# Patient Record
Sex: Female | Born: 1960 | Race: Black or African American | Hispanic: No | Marital: Married | Smoking: Never smoker
Health system: Southern US, Community
[De-identification: ages and names within clinical notes are randomized; demographics above are authoritative.]

## PROBLEM LIST (undated history)

## (undated) DIAGNOSIS — I10 Essential (primary) hypertension: Secondary | ICD-10-CM

## (undated) HISTORY — PX: OVARIAN CYST REMOVAL: SHX89

---

## 2013-06-03 ENCOUNTER — Emergency Department: Payer: Self-pay | Admitting: Emergency Medicine

## 2013-10-18 ENCOUNTER — Emergency Department: Payer: Self-pay | Admitting: Emergency Medicine

## 2013-10-18 LAB — COMPREHENSIVE METABOLIC PANEL
ALBUMIN: 3.4 g/dL (ref 3.4–5.0)
ALK PHOS: 145 U/L — AB
ANION GAP: 4 — AB (ref 7–16)
BUN: 17 mg/dL (ref 7–18)
Bilirubin,Total: 0.4 mg/dL (ref 0.2–1.0)
CALCIUM: 8.8 mg/dL (ref 8.5–10.1)
CO2: 26 mmol/L (ref 21–32)
Chloride: 108 mmol/L — ABNORMAL HIGH (ref 98–107)
Creatinine: 0.91 mg/dL (ref 0.60–1.30)
EGFR (African American): >60
GLUCOSE: 106 mg/dL — AB (ref 65–99)
OSMOLALITY: 278 (ref 275–301)
Potassium: 3.4 mmol/L — ABNORMAL LOW (ref 3.5–5.1)
SGOT(AST): 34 U/L (ref 15–37)
SGPT (ALT): 29 U/L (ref 12–78)
SODIUM: 138 mmol/L (ref 136–145)
TOTAL PROTEIN: 8.2 g/dL (ref 6.4–8.2)

## 2013-10-18 LAB — CBC WITH DIFFERENTIAL/PLATELET
Basophil #: 0.1 10*3/uL (ref 0.0–0.1)
Basophil %: 1.2 %
Eosinophil #: 0 10*3/uL (ref 0.0–0.7)
Eosinophil %: 1 %
HCT: 34.8 % — ABNORMAL LOW (ref 35.0–47.0)
HGB: 11 g/dL — ABNORMAL LOW (ref 12.0–16.0)
Lymphocyte #: 1 10*3/uL (ref 1.0–3.6)
Lymphocyte %: 23.9 %
MCH: 24.8 pg — ABNORMAL LOW (ref 26.0–34.0)
MCHC: 31.6 g/dL — ABNORMAL LOW (ref 32.0–36.0)
MCV: 78 fL — ABNORMAL LOW (ref 80–100)
Monocyte #: 0.2 x10 3/mm (ref 0.2–0.9)
Monocyte %: 5.4 %
NEUTROS PCT: 68.5 %
Neutrophil #: 2.8 10*3/uL (ref 1.4–6.5)
PLATELETS: 144 10*3/uL — AB (ref 150–440)
RBC: 4.43 10*6/uL (ref 3.80–5.20)
RDW: 18.3 % — ABNORMAL HIGH (ref 11.5–14.5)
WBC: 4.1 10*3/uL (ref 3.6–11.0)

## 2013-10-18 LAB — URINALYSIS, COMPLETE
BACTERIA: NONE SEEN
BILIRUBIN, UR: NEGATIVE
Blood: NEGATIVE
GLUCOSE, UR: NEGATIVE mg/dL (ref 0–75)
KETONE: NEGATIVE
LEUKOCYTE ESTERASE: NEGATIVE
NITRITE: NEGATIVE
PH: 5 (ref 4.5–8.0)
PROTEIN: NEGATIVE
RBC,UR: NONE SEEN /HPF (ref 0–5)
Specific Gravity: 1.023 (ref 1.003–1.030)
Squamous Epithelial: <1
WBC UR: NONE SEEN /HPF (ref 0–5)

## 2014-09-26 LAB — COMPREHENSIVE METABOLIC PANEL
ALBUMIN: 3.2 g/dL — AB
AST: 23 U/L
Alkaline Phosphatase: 79 U/L
Anion Gap: 7 (ref 7–16)
BUN: 13 mg/dL
Bilirubin,Total: 0.3 mg/dL
CALCIUM: 9.1 mg/dL
Chloride: 101 mmol/L
Co2: 30 mmol/L
Creatinine: 0.93 mg/dL
EGFR (Non-African Amer.): >60
Glucose: 118 mg/dL — ABNORMAL HIGH
POTASSIUM: 2.7 mmol/L — AB
SGPT (ALT): 16 U/L
Sodium: 138 mmol/L
Total Protein: 8.8 g/dL — ABNORMAL HIGH

## 2014-09-26 LAB — CBC WITH DIFFERENTIAL/PLATELET
BASOS ABS: 0 10*3/uL (ref 0.0–0.1)
BASOS PCT: 0.3 %
Eosinophil #: 0 10*3/uL (ref 0.0–0.7)
Eosinophil %: 0.3 %
HCT: 34.9 % — AB (ref 35.0–47.0)
HGB: 11.3 g/dL — AB (ref 12.0–16.0)
LYMPHS PCT: 14.1 %
Lymphocyte #: 1.1 10*3/uL (ref 1.0–3.6)
MCH: 27.8 pg (ref 26.0–34.0)
MCHC: 32.4 g/dL (ref 32.0–36.0)
MCV: 86 fL (ref 80–100)
Monocyte #: 0.4 x10 3/mm (ref 0.2–0.9)
Monocyte %: 4.7 %
NEUTROS PCT: 80.6 %
Neutrophil #: 6.1 10*3/uL (ref 1.4–6.5)
Platelet: 174 10*3/uL (ref 150–440)
RBC: 4.07 10*6/uL (ref 3.80–5.20)
RDW: 14.2 % (ref 11.5–14.5)
WBC: 7.5 10*3/uL (ref 3.6–11.0)

## 2014-09-26 LAB — URINALYSIS, COMPLETE: Specific Gravity: 1.05 (ref 1.003–1.030)

## 2014-09-27 ENCOUNTER — Inpatient Hospital Stay
Admit: 2014-09-27 | Disposition: A | Discharge status: Home / Self Care | Payer: Self-pay | Attending: Obstetrics and Gynecology | Admitting: Obstetrics and Gynecology

## 2014-09-27 LAB — CBC WITH DIFFERENTIAL/PLATELET
BASOS PCT: 0.1 %
Basophil #: 0 10*3/uL (ref 0.0–0.1)
EOS ABS: 0 10*3/uL (ref 0.0–0.7)
Eosinophil %: 0.1 %
HCT: 32.2 % — AB (ref 35.0–47.0)
HGB: 10.3 g/dL — AB (ref 12.0–16.0)
Lymphocyte #: 0.5 10*3/uL — ABNORMAL LOW (ref 1.0–3.6)
Lymphocyte %: 5.6 %
MCH: 27.4 pg (ref 26.0–34.0)
MCHC: 31.8 g/dL — AB (ref 32.0–36.0)
MCV: 86 fL (ref 80–100)
MONO ABS: 0.5 x10 3/mm (ref 0.2–0.9)
Monocyte %: 5.1 %
NEUTROS ABS: 8.1 10*3/uL — AB (ref 1.4–6.5)
NEUTROS PCT: 89.1 %
Platelet: 163 10*3/uL (ref 150–440)
RBC: 3.74 10*6/uL — ABNORMAL LOW (ref 3.80–5.20)
RDW: 14.2 % (ref 11.5–14.5)
WBC: 9.1 10*3/uL (ref 3.6–11.0)

## 2014-09-27 LAB — HCG, QUANTITATIVE, PREGNANCY: Beta Hcg, Quant.: 4 m[IU]/mL

## 2014-09-27 LAB — WET PREP, GENITAL

## 2014-09-27 LAB — GC/CHLAMYDIA PROBE AMP

## 2014-09-27 LAB — APTT: Activated PTT: 27 secs (ref 23.6–35.9)

## 2014-09-27 LAB — PROTIME-INR
INR: 1.1
Prothrombin Time: 14.5 secs

## 2014-09-29 DIAGNOSIS — Z01818 Encounter for other preprocedural examination: Secondary | ICD-10-CM

## 2014-09-29 LAB — URINALYSIS, COMPLETE
Bacteria: NONE SEEN
Bilirubin,UR: NEGATIVE
Blood: NEGATIVE
Glucose,UR: NEGATIVE mg/dL (ref 0–75)
Ketone: NEGATIVE
Nitrite: NEGATIVE
Ph: 6 (ref 4.5–8.0)
Specific Gravity: 1.025 (ref 1.003–1.030)

## 2014-09-29 LAB — CBC WITH DIFFERENTIAL/PLATELET
BASOS ABS: 0 10*3/uL (ref 0.0–0.1)
Basophil %: 0.2 %
Eosinophil #: 0 10*3/uL (ref 0.0–0.7)
Eosinophil %: 0.4 %
HCT: 30.1 % — AB (ref 35.0–47.0)
HGB: 9.8 g/dL — ABNORMAL LOW (ref 12.0–16.0)
LYMPHS PCT: 8.9 %
Lymphocyte #: 0.6 10*3/uL — ABNORMAL LOW (ref 1.0–3.6)
MCH: 27.8 pg (ref 26.0–34.0)
MCHC: 32.8 g/dL (ref 32.0–36.0)
MCV: 85 fL (ref 80–100)
Monocyte #: 0.5 x10 3/mm (ref 0.2–0.9)
Monocyte %: 7.4 %
NEUTROS ABS: 5.7 10*3/uL (ref 1.4–6.5)
Neutrophil %: 83.1 %
Platelet: 195 10*3/uL (ref 150–440)
RBC: 3.55 10*6/uL — ABNORMAL LOW (ref 3.80–5.20)
RDW: 14.4 % (ref 11.5–14.5)
WBC: 6.9 10*3/uL (ref 3.6–11.0)

## 2014-09-29 LAB — POTASSIUM: POTASSIUM: 2.8 mmol/L — AB

## 2014-09-30 LAB — URINE CULTURE

## 2014-10-01 LAB — BASIC METABOLIC PANEL
ANION GAP: 7 (ref 7–16)
BUN: 9 mg/dL
CHLORIDE: 103 mmol/L
CREATININE: 0.67 mg/dL
Calcium, Total: 8.4 mg/dL — ABNORMAL LOW
Co2: 27 mmol/L
EGFR (African American): >60
EGFR (Non-African Amer.): >60
GLUCOSE: 142 mg/dL — AB
Potassium: 3.9 mmol/L
Sodium: 137 mmol/L

## 2014-10-01 LAB — HEMATOCRIT: HCT: 30.3 % — AB (ref 35.0–47.0)

## 2014-10-04 LAB — SURGICAL PATHOLOGY

## 2014-10-10 NOTE — Consult Note (Signed)
Gynecology ER Consult Noteof Consult: 4/18/2016Physician: [place] Bingharlie Lasaundra Riche, Montez HagemanJr. MD ([facility])OBGYN: nonenoneService[facility] abdominal pain x 1 week 54 y/o P8 (LMP 4 weeks ago) with the above CC. PMHx significant for HTN seen in the ER, BMI 32 and atheriosclerosis seen on CT scan today, h/o c-section x 12.  Patient has one week of LLQ pain that feels like menstrual cramps that was off and on and then felt increasd so she came to the ER. She thought she had a UTI so she took some OTC apap, ibuprofen and dysuria medication that helped until most recently. No fevers, chills, nausea, vomiting (except when she got a medication last night in the ER), chest pain, SOB, diarrhea, constipation (last BM yesterday), dysuria or hematuria.  She is having some VB and states that she's due for a period, although she sometimes goes a few months w/o a period and is having peri-menopausal s/s suchas hot flashes and night sweats. the ER, her CBC is negative (WBC 7.5); CMP negative except for glucose of 118, K 2.7 and albumin 3.2, U/A uninterpretable likely due to OTC medication but rare bacteria and 0-5 squams; negative GC/CT swab and wet prep with just clue cells. CT scan showed a left adnexal fluid collection of 2.7cm that ws enhancing with thickened wall and a 6cm fundal fibroid. Follow up TVUS showed a 10.9 x 5.6 x 6.8cm uterus with a 4.7cm IM fundal fibroid, with ES unable to be seen due to fibroid. RO not seen but LO 3cm with normal flow and 8cm tubular strcture with debris with some vascularity on the peripheray. Images felt c/w pyosalpinx.  In the ER, she's received 50 of fent, 1.5 of diluadid and started on cefoxy/doxy already and had her k supplemented states that she's hungry and isn't in any current pain. Last PO before [place] as aboveas above left knee replacementas above. no h/o STI or abnormal [place] [facility] or illicit drug use. works at  [employer] (as a child)non contributoryas above  70s-100s  120s-150s/80s-100s  18  96/RAwatching TVno MRGsobese, soft, ND. minimally TTP near left ASIS, no peritoneal s/s, +BSno CMT, minimally TTP near left ASIS. minimal old blood/mucus on glove.no c/c/e as aboveas above LLQ pain with left adnexal fluid collection c/w pyosalpingx; pt stableto GYNspoke with o/n radiologist who thought it likely is amenable to IR drainage. beta hcg ordered and set up for CT guided IR drainaged. continue with cefoxy/doxy until after procedure and then can tailor regimen to cultures, cell count, gram stain, etc.  will add flagyl for clue cells seenIV prns. transition to PO once procedure is doneNPO, MIVF, IV anti-emeticsOOB ad lib, SCDspending IR attempt and improved pain.     Electronic Signatures: [place] BingPickens, Fowler Antos (MD) (Signed on 18-Apr-16 04:48)  Authored   Last Updated: 20-Apr-16 07:37 by [place] BingPickens, Mattheus Rauls (MD)

## 2014-10-10 NOTE — H&P (Signed)
   Subjective/Chief Complaint see GYN consult note for details   Past Med/Surgical Hx:  Denies medical history:   left knee replacement:   ALLERGIES:  Penicillin: Rash  Electronic Signatures: [place] BingPickens, Catia Todorov (MD)  (Signed 18-Apr-16 04:57)  Authored: CHIEF COMPLAINT and HISTORY, PAST MEDICAL/SURGIAL HISTORY, ALLERGIES   Last Updated: 18-Apr-16 04:57 by [place] BingPickens, Marcell Pfeifer (MD)

## 2014-10-10 NOTE — Op Note (Signed)
PATIENT NAME:  Sheila Rosales, Sheila Rosales MR#:  [id number] DATE OF BIRTH:  1961-05-28  DATE OF PROCEDURE:  09/30/2014  PREOPERATIVE DIAGNOSES:  1. Abdominal pain.  2. Questionable left adnexal pyosalpinx.  3. BMI 32.   PREOPERATIVE DIAGNOSES:  1. Abdominal pain.  2. Questionable left adnexal pyosalpinx.  3. BMI 32.  4. Severe pelvic adhesive disease.  5.  ?endometriosis in right ovarian cyst  PROCEDURE: Diagnostic laparoscopy, lysis of adhesions greater than 45 minutes, laparoscopic  bilateral salpingo-oophorectomy and cystoscopy.   SURGEON: [place] Bingharlie Hallie Ertl, MD   ASSISTANT: Thomasene MohairStephen Jackson MD  ESTIMATED BLOOD LOSS: 150 mL.   Intravenous fluids: 2300 mL crystalloid.   URINE OUTPUT: 700 mL.   ANTIBIOTICS: None indicated.   DEEP VEIN THROMBOSIS PROPHYLAXIS: SCDs applied to bilateral lower extremities.   SPECIMENS: Bilateral tubes and ovaries to pathology.   COMPLICATIONS: Accidental injury of bowel epiploica nearthe rectum during coagulation mode with the monopolar scissors during lysis of adhesions.   INTRAOPERATIVE CONSULTATION: Dr. Excell Seltzerooper of general surgery.   FINDINGS: On diagnostic laparoscopy, severe pelvic adhesive disease was seen in the lower abdominal pelvic region. Multiple loops of bowel were adherent to the pelvic sidewall, as well as near the adnexa and uterus. These were easily lysed by gentle traction. Grossly appearing uterus that it appeared approximately 10 week size, with moderate adhesions of anterior uterus to the posterior aspect of the anterior abdominal wall, consistent with her prior c-section.. Distinct ovaries were not seen with dilated bilateral fallopian tubes of left greater than right. Small left ovarian cyst punctured with slightly brownish fluid, with question of endometrioma there; there was no obvious evidence of endometriosis, such as chocolate cysts or powder burn spots. The left fallopian tube was inadvertently punctured and yellowish purulent material,  approximately 25 mL, was expressed with the fallopian tube not fully deflating, so there is likely more inside. Normal bladder on cystoscopy with bilateral efflux from the ureteral orifices bilaterally. Small atrophic ovaries seen with possibility of some residual ovarian tissue vs inflammation seen in lower cul-de-sac. Tough, sticky white material seen in posterior cul-de-sac consistent with old inflammation.  DESCRIPTION OF PROCEDURE: The patient was taken to the Operating Room, where the anesthesia was administered. She was then prepped and draped in normal sterile fashion in dorsal lithotomy position in [place] stirrups with the arms tucked. The attempt was made and the patient's abdomen via Palmer's point, but with high opening pressure, inadequate bubble test and negative aspiration. The decision was made to go in at the umbilicus using the open technique. The abdomen was then entered with insufflation gas vented upon injury into the peritoneal cavity. Next, the diagnostic laparoscope was then introduced. There were no injuries noted and at  Palmer's point, it appeared that we had been in the abdomen, despite negative bubble test and aspiration, as well as a high opening pressure. The patient was then placed in Trendelenburg and 2 more ports were placed, which were both 5 mm, in the right and left lower quadrants, as well as a 5 mm port placed at Palmer's point. The lysis of adhesions was then done by using gentle traction to remove the bowel from the pelvic side sidewall, which was easily done down in the area of the left adnexal region. There was a definite rind from inflammation that required sharp dissection of the epiploic from the indurated areas. The left adnexa was tucked behind the uterus, as well as the right adnexa in the posterior cul-de-sac. Using gentle traction and lysis of  adhesions with the LigaSure monopolar device, the left adnexa was freed with inadvertent spillage of left ovarian cyst  fluid, as well as material from the pyosalpinx with the area copiously irrigated and suctioned. Next, the left utero-ovarian ligament was then serially cauterized and transected with LigaSure device and traced back to the fimbria to separate the left adnexa. There was bleeding from the area of the left broad ligament near the uterus, which was serially cauterized with the LigaSure device, as well as the monopolar scissors on coag mode, which is where the possible injury occurred to the bowel epiploica near the sigmoid rectosigmoid region and Dr. Excell Seltzer was called in. He also felt that this was just slight coag on the bowel epiploica with a distinct junction seen between that and the actual bowel inferiorly and nothing else was to be done. This area was packed with Fibrillar and an Endo Catch bag was then inserted into the patient's abdomen, the left adnexa placed into it and removed via the umbilical port site via morcellation of the specimen without damage to the bag. Attention was then turned to the right adnexa were similar process was done with further lysis of adhesions with the right adnexa removed. During both dissections of the bilateral adnexa, actual ovarian tissue was not definitively seen and the left IP ligaments were cauterized during the dissection. If anything, possibility of left ovarian remnants were left versus inflammatory rind with very much lower suspicion of any ovarian tissue left on the right side. The IP ligaments on both sides were serially transected, although the ureters were not definitively seen. The anatomy in this area at the pelvic brim and higher up was normal after lysis of adhesions of the bowel. Prior to cauterization and transection of the left IP ligament, the left round ligament was  cauterized and transected, and the peritoneum was opened in order to further dissect out this area. Next, the bladder was then backfilled with 240 mL of normal saline and the Foley catheter  removed and a 5 mm, 30 degree laparoscope was then introduced into the bladder cavity with the above-noted findings. Foley catheter was then replaced and the right lower quadrant port site was then closed with a Leverne Humbles device due to continued slippage of this port site throughout the case. The umbilical and all port sites were then removed under direct visualization. The abdomen was then desufflated and the umbilical port was also removed and the fascia closed with a figure-of-eight suture of 0 Vicryl and the skin there closed with 4-0 Monocryl in a subcuticular fashion and the other port sites closed with Dermabond, with the right lower quadrant port site fascia closed with 0 Vicryl using the [employer] device. Sponge, lap, needle, and instrument counts were correct x2 and the patient was taken to the recovery room, awake, alert, breathing independently in stable condition.   ____________________________ [place] Bing, MD cp:AT Rosales: 10/01/2014 07:35:25 ET T: 10/01/2014 08:06:01 ET JOB#: [id number]  cc: [place] Bing, MD, <Dictator> [place] Bing MD ELECTRONICALLY SIGNED 10/07/2014 18:41

## 2014-12-01 ENCOUNTER — Emergency Department: Payer: Self-pay

## 2014-12-01 ENCOUNTER — Emergency Department
Admission: EM | Admit: 2014-12-01 | Discharge: 2014-12-01 | Disposition: A | Discharge status: Home / Self Care | Payer: Self-pay | Attending: Student | Admitting: Student

## 2014-12-01 ENCOUNTER — Encounter: Payer: Self-pay | Admitting: Emergency Medicine

## 2014-12-01 DIAGNOSIS — J209 Acute bronchitis, unspecified: Secondary | ICD-10-CM | POA: Insufficient documentation

## 2014-12-01 DIAGNOSIS — I10 Essential (primary) hypertension: Secondary | ICD-10-CM | POA: Insufficient documentation

## 2014-12-01 DIAGNOSIS — R21 Rash and other nonspecific skin eruption: Secondary | ICD-10-CM | POA: Insufficient documentation

## 2014-12-01 DIAGNOSIS — Z880 Allergy status to penicillin: Secondary | ICD-10-CM | POA: Insufficient documentation

## 2014-12-01 DIAGNOSIS — J40 Bronchitis, not specified as acute or chronic: Secondary | ICD-10-CM

## 2014-12-01 HISTORY — DX: Essential (primary) hypertension: I10

## 2014-12-01 MED ORDER — RANITIDINE HCL 150 MG PO TABS: 150.0000 mg | ORAL_TABLET | Freq: Two times a day (BID) | ORAL | Status: DC

## 2014-12-01 MED ORDER — HYDROXYZINE PAMOATE 25 MG PO CAPS: 25.0000 mg | ORAL_CAPSULE | Freq: Three times a day (TID) | ORAL | Status: AC | PRN

## 2014-12-01 MED ORDER — AZITHROMYCIN 250 MG PO TABS: ORAL_TABLET | ORAL | Status: DC

## 2014-12-01 MED ORDER — GUAIFENESIN-CODEINE 100-10 MG/5ML PO SOLN: 10.0000 mL | ORAL | Status: DC | PRN

## 2014-12-01 NOTE — ED Provider Notes (Signed)
[facility] Emergency Department Provider Note  ____________________________________________  Time seen: Approximately 4:39 PM  I have reviewed the triage vital signs and the nursing notes.   HISTORY  Chief Complaint Cough and Rash    HPI Sheila Rosales is a 54 y.o. female who presents to the emergency room for evaluation of a continuous cough 1 week. Patient denies any fever chills. States cough is nonproductive. In addition she reports having a rash on both upper arms. Patient reports diarrhea times one this morning and desires a note for work.   Past Medical History  Diagnosis Date  . Hypertension     There are no active problems to display for this patient.   Past Surgical History  Procedure Laterality Date  . Ovarian cyst removal      Current Outpatient Rx  Name  Route  Sig  Dispense  Refill  . azithromycin (ZITHROMAX Z-PAK) 250 MG tablet      Take 2 tablets (500 mg) on  Day 1,  followed by 1 tablet (250 mg) once daily on Days 2 through 5.   6 each   0   . guaiFENesin-codeine 100-10 MG/5ML syrup   Oral   Take 10 mLs by mouth every 4 (four) hours as needed for cough.   180 mL   0   . hydrOXYzine (VISTARIL) 25 MG capsule   Oral   Take 1 capsule (25 mg total) by mouth 3 (three) times daily as needed.   30 capsule   0   . ranitidine (ZANTAC) 150 MG tablet   Oral   Take 1 tablet (150 mg total) by mouth 2 (two) times daily.   14 tablet   1     Allergies Penicillins  No family history on file.  Social History History  Substance Use Topics  . Smoking status: Never Smoker   . Smokeless tobacco: Not on file  . Alcohol Use: No    Review of Systems Constitutional: No fever/chills Eyes: No visual changes. ENT: No sore throat. Cardiovascular: Denies chest pain. Respiratory: Denies shortness of breath. Positive cough. Gastrointestinal: No abdominal pain.  No nausea, no vomiting.  No diarrhea.  No  constipation. Genitourinary: Negative for dysuria. Musculoskeletal: Negative for back pain. Skin: Negative for rash. Neurological: Negative for headaches, focal weakness or numbness.  10-point ROS otherwise negative.  ____________________________________________   PHYSICAL EXAM:  VITAL SIGNS: ED Triage Vitals  Enc Vitals Group     BP 12/01/14 1621 167/105 mmHg     Pulse Rate 12/01/14 1621 93     Resp 12/01/14 1621 20     Temp 12/01/14 1621 97.5 F (36.4 C)     Temp src --      SpO2 12/01/14 1621 98 %     Weight 12/01/14 1621 225 lb (102.059 kg)     Height 12/01/14 1621 5\' 3"  (1.6 m)     Head Cir --      Peak Flow --      Pain Score 12/01/14 1622 10     Pain Loc --      Pain Edu? --      Excl. in GC? --     Constitutional: Alert and oriented. Well appearing and in no acute distress. Eyes: Conjunctivae are normal. PERRL. EOMI. Head: Atraumatic. Nose: No congestion/rhinnorhea. Mouth/Throat: Mucous membranes are moist.  Oropharynx non-erythematous. Neck: No stridor.   Cardiovascular: Normal rate, regular rhythm. Grossly normal heart sounds.  Good peripheral circulation. Respiratory: Normal respiratory effort.  No retractions. Lungs CTAB. Gastrointestinal: Soft and nontender. No distention. No abdominal bruits. No CVA tenderness. Musculoskeletal: No lower extremity tenderness nor edema.  No joint effusions. Neurologic:  Normal speech and language. No gross focal neurologic deficits are appreciated. Speech is normal. No gait instability. Skin:  Skin is warm, dry and intact. Rash noted both upper extremities bilaterally. Excoriated lesions noted. No pustules or vesicles noted Psychiatric: Mood and affect are normal. Speech and behavior are normal.  ____________________________________________   LABS (all labs ordered are listed, but only abnormal results are displayed)  Labs Reviewed - No data to  display ____________________________________________  EKG  Deferred ____________________________________________  RADIOLOGY  Right negative interpreted by myself. Confirmed by radiology. ____________________________________________   PROCEDURES  Procedure(s) performed: None  Critical Care performed: No  ____________________________________________   INITIAL IMPRESSION / ASSESSMENT AND PLAN / ED COURSE  Pertinent labs & imaging results that were available during my care of the patient were reviewed by me and considered in my medical decision making (see chart for details).  Upper respiratory infection. Will Rx with Zithromax and Robitussin-AC.  hydroxyzine and ranitidine given for itching.  Patient voices no other emergency medical complaints at this time and will return with worsening symptomology. ____________________________________________   FINAL CLINICAL IMPRESSION(S) / ED DIAGNOSES  Final diagnoses:  Bronchitis      Evangeline Dakin, PA-C 12/01/14 1752  Gayla Doss, MD 12/02/14 [phone]

## 2014-12-01 NOTE — ED Notes (Signed)
Pt ambulatory to triage with c/o cough x1 week, rash and diarrhea today. Pt denies smoking.

## 2014-12-01 NOTE — Discharge Instructions (Signed)
Upper Respiratory Infection, Adult An upper respiratory infection (URI) is also sometimes known as the common cold. The upper respiratory tract includes the nose, sinuses, throat, trachea, and bronchi. Bronchi are the airways leading to the lungs. Most people improve within 1 week, but symptoms can last up to 2 weeks. A residual cough may last even longer.  CAUSES Many different viruses can infect the tissues lining the upper respiratory tract. The tissues become irritated and inflamed and often become very moist. Mucus production is also common. A cold is contagious. You can easily spread the virus to others by oral contact. This includes kissing, sharing a glass, coughing, or sneezing. Touching your mouth or nose and then touching a surface, which is then touched by another person, can also spread the virus. SYMPTOMS  Symptoms typically develop 1 to 3 days after you come in contact with a cold virus. Symptoms vary from person to person. They may include:  Runny nose.  Sneezing.  Nasal congestion.  Sinus irritation.  Sore throat.  Loss of voice (laryngitis).  Cough.  Fatigue.  Muscle aches.  Loss of appetite.  Headache.  Low-grade fever. DIAGNOSIS  You might diagnose your own cold based on familiar symptoms, since most people get a cold 2 to 3 times a year. Your caregiver can confirm this based on your exam. Most importantly, your caregiver can check that your symptoms are not due to another disease such as strep throat, sinusitis, pneumonia, asthma, or epiglottitis. Blood tests, throat tests, and X-rays are not necessary to diagnose a common cold, but they may sometimes be helpful in excluding other more serious diseases. Your caregiver will decide if any further tests are required. RISKS AND COMPLICATIONS  You may be at risk for a more severe case of the common cold if you smoke cigarettes, have chronic heart disease (such as heart failure) or lung disease (such as asthma), or if  you have a weakened immune system. The very young and very old are also at risk for more serious infections. Bacterial sinusitis, middle ear infections, and bacterial pneumonia can complicate the common cold. The common cold can worsen asthma and chronic obstructive pulmonary disease (COPD). Sometimes, these complications can require emergency medical care and may be life-threatening. PREVENTION  The best way to protect against getting a cold is to practice good hygiene. Avoid oral or hand contact with people with cold symptoms. Wash your hands often if contact occurs. There is no clear evidence that vitamin C, vitamin E, echinacea, or exercise reduces the chance of developing a cold. However, it is always recommended to get plenty of rest and practice good nutrition. TREATMENT  Treatment is directed at relieving symptoms. There is no cure. Antibiotics are not effective, because the infection is caused by a virus, not by bacteria. Treatment may include:  Increased fluid intake. Sports drinks offer valuable electrolytes, sugars, and fluids.  Breathing heated mist or steam (vaporizer or shower).  Eating chicken soup or other clear broths, and maintaining good nutrition.  Getting plenty of rest.  Using gargles or lozenges for comfort.  Controlling fevers with ibuprofen or acetaminophen as directed by your caregiver.  Increasing usage of your inhaler if you have asthma. Zinc gel and zinc lozenges, taken in the first 24 hours of the common cold, can shorten the duration and lessen the severity of symptoms. Pain medicines may help with fever, muscle aches, and throat pain. A variety of non-prescription medicines are available to treat congestion and runny nose. Your caregiver   can make recommendations and may suggest nasal or lung inhalers for other symptoms.  HOME CARE INSTRUCTIONS   Only take over-the-counter or prescription medicines for pain, discomfort, or fever as directed by your  caregiver.  Use a warm mist humidifier or inhale steam from a shower to increase air moisture. This may keep secretions moist and make it easier to breathe.  Drink enough water and fluids to keep your urine clear or pale yellow.  Rest as needed.  Return to work when your temperature has returned to normal or as your caregiver advises. You may need to stay home longer to avoid infecting others. You can also use a face mask and careful hand washing to prevent spread of the virus. SEEK MEDICAL CARE IF:   After the first few days, you feel you are getting worse rather than better.  You need your caregiver's advice about medicines to control symptoms.  You develop chills, worsening shortness of breath, or brown or red sputum. These may be signs of pneumonia.  You develop yellow or brown nasal discharge or pain in the face, especially when you bend forward. These may be signs of sinusitis.  You develop a fever, swollen neck glands, pain with swallowing, or white areas in the back of your throat. These may be signs of strep throat. SEEK IMMEDIATE MEDICAL CARE IF:   You have a fever.  You develop severe or persistent headache, ear pain, sinus pain, or chest pain.  You develop wheezing, a prolonged cough, cough up blood, or have a change in your usual mucus (if you have chronic lung disease).  You develop sore muscles or a stiff neck. Document Released: 11/21/2000 Document Revised: 08/20/2011 Document Reviewed: 09/02/2013 [employer] Patient Information 2015 [employer]. This information is not intended to replace advice given to you by your health care provider. Make sure you discuss any questions you have with your health care provider.  

## 2015-04-20 ENCOUNTER — Emergency Department: Payer: Self-pay

## 2015-04-20 ENCOUNTER — Emergency Department
Admission: EM | Admit: 2015-04-20 | Discharge: 2015-04-20 | Disposition: A | Discharge status: Home / Self Care | Payer: Self-pay | Attending: Emergency Medicine | Admitting: Emergency Medicine

## 2015-04-20 ENCOUNTER — Encounter: Payer: Self-pay | Admitting: Emergency Medicine

## 2015-04-20 DIAGNOSIS — I10 Essential (primary) hypertension: Secondary | ICD-10-CM

## 2015-04-20 DIAGNOSIS — Z79899 Other long term (current) drug therapy: Secondary | ICD-10-CM | POA: Insufficient documentation

## 2015-04-20 DIAGNOSIS — M25562 Pain in left knee: Secondary | ICD-10-CM | POA: Insufficient documentation

## 2015-04-20 DIAGNOSIS — Z880 Allergy status to penicillin: Secondary | ICD-10-CM | POA: Insufficient documentation

## 2015-04-20 DIAGNOSIS — J159 Unspecified bacterial pneumonia: Secondary | ICD-10-CM | POA: Insufficient documentation

## 2015-04-20 DIAGNOSIS — G8929 Other chronic pain: Secondary | ICD-10-CM

## 2015-04-20 DIAGNOSIS — J189 Pneumonia, unspecified organism: Secondary | ICD-10-CM

## 2015-04-20 MED ORDER — MELOXICAM 15 MG PO TABS: 15.0000 mg | ORAL_TABLET | Freq: Every day | ORAL | Status: AC

## 2015-04-20 MED ORDER — GUAIFENESIN-CODEINE 100-10 MG/5ML PO SOLN: 10.0000 mL | Freq: Three times a day (TID) | ORAL | Status: DC | PRN

## 2015-04-20 MED ORDER — TRAMADOL HCL 50 MG PO TABS
50.0000 mg | ORAL_TABLET | Freq: Once | ORAL | Status: AC
  Administered 2015-04-20: 50 mg via ORAL
  Filled 2015-04-20: qty 1

## 2015-04-20 MED ORDER — AZITHROMYCIN 250 MG PO TABS: ORAL_TABLET | ORAL | Status: DC

## 2015-04-20 NOTE — Discharge Instructions (Signed)
Your blood pressure is high today. It is important that you follow up with a Primary Care Provider for further monitoring and treatment. Call tomorrow to schedule an appointment. Return to the ER for symptoms of concern. Follow up with orthopedics regarding your knee pain and swelling.   Community-Acquired Pneumonia, Adult Pneumonia is an infection of the lungs. There are different types of pneumonia. One type can develop while a person is in a hospital. A different type, called community-acquired pneumonia, develops in people who are not, or have not recently been, in the hospital or other health care facility.  CAUSES Pneumonia may be caused by bacteria, viruses, or funguses. Community-acquired pneumonia is often caused by Streptococcus pneumonia bacteria. These bacteria are often passed from one person to another by breathing in droplets from the cough or sneeze of an infected person. RISK FACTORS The condition is more likely to develop in:  People who havechronic diseases, such as chronic obstructive pulmonary disease (COPD), asthma, congestive heart failure, cystic fibrosis, diabetes, or kidney disease.  People who haveearly-stage or late-stage HIV.  People who havesickle cell disease.  People who havehad their spleen removed (splenectomy).  People who havepoor Administrator.  People who havemedical conditions that increase the risk of breathing in (aspirating) secretions their own mouth and nose.   People who havea weakened immune system (immunocompromised).  People who smoke.  People whotravel to areas where pneumonia-causing germs commonly exist.  People whoare around animal habitats or animals that have pneumonia-causing germs, including birds, bats, rabbits, cats, and farm animals. SYMPTOMS Symptoms of this condition include:  Adry cough.  A wet (productive) cough.  Fever.  Sweating.  Chest pain, especially when breathing deeply or coughing.  Rapid  breathing or difficulty breathing.  Shortness of breath.  Shaking chills.  Fatigue.  Muscle aches. DIAGNOSIS Your health care provider will take a medical history and perform a physical exam. You may also have other tests, including:  Imaging studies of your chest, including X-rays.  Tests to check your blood oxygen level and other blood gases.  Other tests on blood, mucus (sputum), fluid around your lungs (pleural fluid), and urine. If your pneumonia is severe, other tests may be done to identify the specific cause of your illness. TREATMENT The type of treatment that you receive depends on many factors, such as the cause of your pneumonia, the medicines you take, and other medical conditions that you have. For most adults, treatment and recovery from pneumonia may occur at home. In some cases, treatment must happen in a hospital. Treatment may include:  Antibiotic medicines, if the pneumonia was caused by bacteria.  Antiviral medicines, if the pneumonia was caused by a virus.  Medicines that are given by mouth or through an IV tube.  Oxygen.  Respiratory therapy. Although rare, treating severe pneumonia may include:  Mechanical ventilation. This is done if you are not breathing well on your own and you cannot maintain a safe blood oxygen level.  Thoracentesis. This procedureremoves fluid around one lung or both lungs to help you breathe better. HOME CARE INSTRUCTIONS  Take over-the-counter and prescription medicines only as told by your health care provider.  Only takecough medicine if you are losing sleep. Understand that cough medicine can prevent your body's natural ability to remove mucus from your lungs.  If you were prescribed an antibiotic medicine, take it as told by your health care provider. Do not stop taking the antibiotic even if you start to feel better.  Sleep in  a semi-upright position at night. Try sleeping in a reclining chair, or place a few pillows  under your head.  Do not use tobacco products, including cigarettes, chewing tobacco, and e-cigarettes. If you need help quitting, ask your health care provider.  Drink enough water to keep your urine clear or pale yellow. This will help to thin out mucus secretions in your lungs. PREVENTION There are ways that you can decrease your risk of developing community-acquired pneumonia. Consider getting a pneumococcal vaccine if:  You are older than 52 years of age.  You are older than 52 years of age and are undergoing cancer treatment, have chronic lung disease, or have other medical conditions that affect your immune system. Ask your health care provider if this applies to you. There are different types and schedules of pneumococcal vaccines. Ask your health care provider which vaccination option is best for you. You may also prevent community-acquired pneumonia if you take these actions:  Get an influenza vaccine every year. Ask your health care provider which type of influenza vaccine is best for you.  Go to the dentist on a regular basis.  Wash your hands often. Use hand sanitizer if soap and water are not available. SEEK MEDICAL CARE IF:  You have a fever.  You are losing sleep because you cannot control your cough with cough medicine. SEEK IMMEDIATE MEDICAL CARE IF:  You have worsening shortness of breath.  You have increased chest pain.  Your sickness becomes worse, especially if you are an older adult or have a weakened immune system.  You cough up blood.   This information is not intended to replace advice given to you by your health care provider. Make sure you discuss any questions you have with your health care provider.   Document Released: 05/28/2005 Document Revised: 02/16/2015 Document Reviewed: 09/22/2014 Elsevier Interactive Patient Education [employer]2016 Elsevier Inc.

## 2015-04-20 NOTE — ED Notes (Signed)
Pt presents with cough and left knee pain for two weeks.

## 2015-04-20 NOTE — ED Provider Notes (Signed)
[facility] Emergency Department Provider Note ____________________________________________  Time seen: Approximately 3:09 PM  I have reviewed the triage vital signs and the nursing notes.   HISTORY  Chief Complaint Cough and Knee Pain   HPI Sheila Rosales is a 54 y.o. female who presents to the emergency department for evaluation of cough x 2 weeks and left knee pain x 1 month. Cough not relieved with Mucinex. She has not tried anything for the knee pain. No injury. History of knee replacement.She does not have a PCP and has not been on medication for arthritis or hypertension in "a long time."  Hypertension is asymptomatic.   Past Medical History  Diagnosis Date  . Hypertension     There are no active problems to display for this patient.   Past Surgical History  Procedure Laterality Date  . Ovarian cyst removal      Current Outpatient Rx  Name  Route  Sig  Dispense  Refill  . azithromycin (ZITHROMAX) 250 MG tablet      2 tablets today, then 1 tablet for the next 4 days.   6 each   0   . guaiFENesin-codeine 100-10 MG/5ML syrup   Oral   Take 10 mLs by mouth 3 (three) times daily as needed.   120 mL   0   . hydrOXYzine (VISTARIL) 25 MG capsule   Oral   Take 1 capsule (25 mg total) by mouth 3 (three) times daily as needed.   30 capsule   0   . meloxicam (MOBIC) 15 MG tablet   Oral   Take 1 tablet (15 mg total) by mouth daily.   30 tablet   0   . ranitidine (ZANTAC) 150 MG tablet   Oral   Take 1 tablet (150 mg total) by mouth 2 (two) times daily.   14 tablet   1     Allergies Penicillins  No family history on file.  Social History Social History  Substance Use Topics  . Smoking status: Never Smoker   . Smokeless tobacco: None  . Alcohol Use: No    Review of Systems Constitutional: No fever/chills Eyes: No visual changes. ENT: No sore throat. Cardiovascular: Denies chest pain. Respiratory: Denies shortness of  breath. Positive for non productive cough. Gastrointestinal: No abdominal pain.  No nausea, no vomiting.  No diarrhea.  No constipation. Genitourinary: Negative for dysuria. Musculoskeletal: Negative for back pain. Skin: Negative for rash. Neurological: Negative for headaches, focal weakness or numbness.  10-point ROS otherwise negative.  ____________________________________________   PHYSICAL EXAM:  VITAL SIGNS: ED Triage Vitals  Enc Vitals Group     BP 04/20/15 1442 153/101 mmHg     Pulse Rate 04/20/15 1442 94     Resp 04/20/15 1442 20     Temp 04/20/15 1442 97.7 F (36.5 C)     Temp Source 04/20/15 1442 Oral     SpO2 04/20/15 1442 92 %     Weight 04/20/15 1442 202 lb (91.627 kg)     Height 04/20/15 1442  (1.6 m)     Head Cir --      Peak Flow --      Pain Score 04/20/15 1443 10     Pain Loc --      Pain Edu? --      Excl. in GC? --     Constitutional: Alert and oriented. Well appearing and in no acute distress. Eyes: Conjunctivae are normal. PERRL. EOMI. Head: Atraumatic. Nose:  No congestion/rhinnorhea. Mouth/Throat: Mucous membranes are moist.  Oropharynx non-erythematous. Neck: No stridor.   Cardiovascular: Normal rate, regular rhythm. Grossly normal heart sounds.  Good peripheral circulation. Respiratory: Normal respiratory effort.  No retractions. Lungs CTAB, diminished throughout. Gastrointestinal: Soft and nontender. No distention. No abdominal bruits. No CVA tenderness. Musculoskeletal: Left knee mildly edematous without erythema. Pain with valgus stress worse than varus. Negative anterior drawer test.  Neurologic:  Normal speech and language. No gross focal neurologic deficits are appreciated. No gait instability. Skin:  Skin is warm, dry and intact. No rash noted. Psychiatric: Mood and affect are normal. Speech and behavior are normal.  ____________________________________________   LABS (all labs ordered are listed, but only abnormal results are  displayed)  Labs Reviewed - No data to display ____________________________________________  EKG   ____________________________________________  RADIOLOGY  Early left lower lobe infiltrate.  I, Kem Boroughsari Kenshawn Maciolek, personally viewed and evaluated these images (plain radiographs) as part of my medical decision making.   ____________________________________________   PROCEDURES  Procedure(s) performed: None  Critical Care performed: No  ____________________________________________   INITIAL IMPRESSION / ASSESSMENT AND PLAN / ED COURSE  Pertinent labs & imaging results that were available during my care of the patient were reviewed by me and considered in my medical decision making (see chart for details).  Patient was strongly advised to [facility] for [facility] of HTN as well as follow up for pneumonia. ER return precautions were discussed.   She is to take Azithromycin, Robitussin AC, and Meloxicam as prescribed. ____________________________________________   FINAL CLINICAL IMPRESSION(S) / ED DIAGNOSES  Final diagnoses:  Essential hypertension  Community acquired pneumonia  Knee pain, chronic, left      Chinita PesterCari B Laquetta Racey, FNP 04/20/15 1553  Arnaldo NatalPaul F Malinda, MD 04/20/15 2302

## 2015-05-12 ENCOUNTER — Encounter: Payer: Self-pay | Admitting: *Deleted

## 2015-05-12 ENCOUNTER — Emergency Department: Payer: Self-pay

## 2015-05-12 ENCOUNTER — Emergency Department
Admission: EM | Admit: 2015-05-12 | Discharge: 2015-05-12 | Disposition: A | Discharge status: Home / Self Care | Payer: Self-pay | Attending: Emergency Medicine | Admitting: Emergency Medicine

## 2015-05-12 DIAGNOSIS — R202 Paresthesia of skin: Secondary | ICD-10-CM | POA: Insufficient documentation

## 2015-05-12 DIAGNOSIS — Z79899 Other long term (current) drug therapy: Secondary | ICD-10-CM | POA: Insufficient documentation

## 2015-05-12 DIAGNOSIS — R7401 Elevation of levels of liver transaminase levels: Secondary | ICD-10-CM

## 2015-05-12 DIAGNOSIS — Z880 Allergy status to penicillin: Secondary | ICD-10-CM | POA: Insufficient documentation

## 2015-05-12 DIAGNOSIS — Z791 Long term (current) use of non-steroidal anti-inflammatories (NSAID): Secondary | ICD-10-CM | POA: Insufficient documentation

## 2015-05-12 DIAGNOSIS — R109 Unspecified abdominal pain: Secondary | ICD-10-CM

## 2015-05-12 DIAGNOSIS — Z23 Encounter for immunization: Secondary | ICD-10-CM | POA: Insufficient documentation

## 2015-05-12 DIAGNOSIS — R1011 Right upper quadrant pain: Secondary | ICD-10-CM | POA: Insufficient documentation

## 2015-05-12 DIAGNOSIS — R1013 Epigastric pain: Secondary | ICD-10-CM | POA: Insufficient documentation

## 2015-05-12 DIAGNOSIS — R740 Nonspecific elevation of levels of transaminase and lactic acid dehydrogenase [LDH]: Secondary | ICD-10-CM | POA: Insufficient documentation

## 2015-05-12 DIAGNOSIS — I10 Essential (primary) hypertension: Secondary | ICD-10-CM | POA: Insufficient documentation

## 2015-05-12 LAB — COMPREHENSIVE METABOLIC PANEL
ALT: 126 U/L — AB (ref 14–54)
AST: 131 U/L — AB (ref 15–41)
Albumin: 3.7 g/dL (ref 3.5–5.0)
Alkaline Phosphatase: 116 U/L (ref 38–126)
Anion gap: 7 (ref 5–15)
BUN: 17 mg/dL (ref 6–20)
CALCIUM: 9.2 mg/dL (ref 8.9–10.3)
CHLORIDE: 109 mmol/L (ref 101–111)
CO2: 26 mmol/L (ref 22–32)
Creatinine, Ser: 0.8 mg/dL (ref 0.44–1.00)
GFR calc non Af Amer: >60 mL/min (ref >60)
GLUCOSE: 94 mg/dL (ref 65–99)
POTASSIUM: 3.7 mmol/L (ref 3.5–5.1)
SODIUM: 142 mmol/L (ref 135–145)
TOTAL PROTEIN: 9.1 g/dL — AB (ref 6.5–8.1)
Total Bilirubin: 0.7 mg/dL (ref 0.3–1.2)

## 2015-05-12 LAB — LIPASE, BLOOD: LIPASE: 33 U/L (ref 11–51)

## 2015-05-12 LAB — URINALYSIS COMPLETE WITH MICROSCOPIC (ARMC ONLY)
BILIRUBIN URINE: NEGATIVE
Bacteria, UA: NONE SEEN
GLUCOSE, UA: NEGATIVE mg/dL
HGB URINE DIPSTICK: NEGATIVE
KETONES UR: NEGATIVE mg/dL
LEUKOCYTES UA: NEGATIVE
NITRITE: NEGATIVE
PH: 6 (ref 5.0–8.0)
Protein, ur: NEGATIVE mg/dL
RBC / HPF: NONE SEEN RBC/hpf (ref 0–5)
SPECIFIC GRAVITY, URINE: 1.019 (ref 1.005–1.030)

## 2015-05-12 LAB — CBC
HCT: 39 % (ref 35.0–47.0)
Hemoglobin: 12.5 g/dL (ref 12.0–16.0)
MCH: 27.6 pg (ref 26.0–34.0)
MCHC: 32.2 g/dL (ref 32.0–36.0)
MCV: 85.7 fL (ref 80.0–100.0)
PLATELETS: 144 10*3/uL — AB (ref 150–440)
RBC: 4.55 MIL/uL (ref 3.80–5.20)
RDW: 16.8 % — AB (ref 11.5–14.5)
WBC: 3.5 10*3/uL — AB (ref 3.6–11.0)

## 2015-05-12 MED ORDER — IBUPROFEN 400 MG PO TABS
600.0000 mg | ORAL_TABLET | ORAL | Status: AC
  Administered 2015-05-12: 600 mg via ORAL
  Filled 2015-05-12: qty 2

## 2015-05-12 MED ORDER — GABAPENTIN 300 MG PO CAPS: 300.0000 mg | ORAL_CAPSULE | Freq: Two times a day (BID) | ORAL | Status: DC

## 2015-05-12 MED ORDER — TETANUS-DIPHTH-ACELL PERTUSSIS 5-2.5-18.5 LF-MCG/0.5 IM SUSP
INTRAMUSCULAR | Status: AC
  Administered 2015-05-12: 0.5 mL via INTRAMUSCULAR
  Filled 2015-05-12: qty 0.5

## 2015-05-12 MED ORDER — TETANUS-DIPHTH-ACELL PERTUSSIS 5-2.5-18.5 LF-MCG/0.5 IM SUSP
0.5000 mL | Freq: Once | INTRAMUSCULAR | Status: AC
  Administered 2015-05-12: 0.5 mL via INTRAMUSCULAR

## 2015-05-12 NOTE — ED Provider Notes (Signed)
[facility] Emergency Department Provider Note REMINDER - THIS NOTE IS NOT A FINAL MEDICAL RECORD UNTIL IT IS SIGNED. UNTIL THEN, THE CONTENT BELOW MAY REFLECT INFORMATION FROM A DOCUMENTATION TEMPLATE, NOT THE ACTUAL PATIENT VISIT. ____________________________________________  Time seen: Approximately 9:30 PM  I have reviewed the triage vital signs and the nursing notes.   HISTORY  Chief Complaint Abdominal Pain    HPI Sheila Rosales is a 54 y.o. female previous history of high blood pressure as well as reports a hysterectomy.  The patient reports that since about the time of her hysterectomy roughly 6 months ago she has had intermittent discomfort in the right side of the abdomen. She reports that she occasionally gets pain in the right lower abdomen, and also gets burning in the bottom of her feet for the last several months. She denies any fevers chills nausea or vomiting. She reports that she has intermittent discomfort at present time it seems to resolve, but was somewhat worse this afternoon. No pain or radiation to the back. Presently endorsing only minimal discomfort.  Notable urinating, no pain or burning with urination.Denies pregnancy.     Past Medical History  Diagnosis Date  . Hypertension     There are no active problems to display for this patient.   Past Surgical History  Procedure Laterality Date  . Ovarian cyst removal      Current Outpatient Rx  Name  Route  Sig  Dispense  Refill  . azithromycin (ZITHROMAX) 250 MG tablet      2 tablets today, then 1 tablet for the next 4 days.   6 each   0   . gabapentin (NEURONTIN) 300 MG capsule   Oral   Take 1 capsule (300 mg total) by mouth 2 (two) times daily.   28 capsule   0   . guaiFENesin-codeine 100-10 MG/5ML syrup   Oral   Take 10 mLs by mouth 3 (three) times daily as needed.   120 mL   0   . hydrOXYzine (VISTARIL) 25 MG capsule   Oral   Take 1 capsule (25 mg total)  by mouth 3 (three) times daily as needed.   30 capsule   0   . meloxicam (MOBIC) 15 MG tablet   Oral   Take 1 tablet (15 mg total) by mouth daily.   30 tablet   0   . ranitidine (ZANTAC) 150 MG tablet   Oral   Take 1 tablet (150 mg total) by mouth 2 (two) times daily.   14 tablet   1     Allergies Penicillins  No family history on file.  Social History Social History  Substance Use Topics  . Smoking status: Never Smoker   . Smokeless tobacco: None  . Alcohol Use: No    Review of Systems Constitutional: No fever/chills Eyes: No visual changes. ENT: No sore throat. Cardiovascular: Denies chest pain. Respiratory: Denies shortness of breath. Gastrointestinal: No nausea, no vomiting.  No diarrhea.  No constipation. Genitourinary: Negative for dysuria. Eyes pregnancy. No vaginal spotting or bleeding. Musculoskeletal: Negative for back pain. Skin: Negative for rash. Neurological: Negative for headaches, focal weakness or numbness. She does experience a burning and tingling sensation over her feet for the last several months.  10-point ROS otherwise negative.  ____________________________________________   PHYSICAL EXAM:  VITAL SIGNS: ED Triage Vitals  Enc Vitals Group     BP 05/12/15 1813 174/114 mmHg     Pulse Rate 05/12/15 1813 96  Resp 05/12/15 1813 18     Temp 05/12/15 1813 98.2 F (36.8 C)     Temp Source 05/12/15 1813 Oral     SpO2 05/12/15 1813 98 %     Weight 05/12/15 1813 202 lb (91.627 kg)     Height 05/12/15 1813  (1.6 m)     Head Cir --      Peak Flow --      Pain Score 05/12/15 1817 10     Pain Loc --      Pain Edu? --      Excl. in GC? --    Constitutional: Alert and oriented. Well appearing and in no acute distress. Eyes: Conjunctivae are normal. PERRL. EOMI. Head: Atraumatic. Nose: No congestion/rhinnorhea. Mouth/Throat: Mucous membranes are moist.  Oropharynx non-erythematous. Neck: No stridor.   Cardiovascular: Normal rate,  regular rhythm. Grossly normal heart sounds.  Good peripheral circulation. Respiratory: Normal respiratory effort.  No retractions. Lungs CTAB. Gastrointestinal: Soft and nontender to for some minimal tenderness noted in the epigastrium and right upper quadrant without associated rebound or guarding. No pain at McBurney's point. Negative Rovsing.. No distention. No abdominal bruits. No CVA tenderness. Musculoskeletal: No lower extremity tenderness nor edema.  No joint effusions. Neurologic:  Normal speech and language. No gross focal neurologic deficits are appreciated. No gait instability. She does report a mild tingling sensation over the bottom of her feet bilaterally, that her pain and this "burning" sensation will come and go. No lesions noted at the bottom of the foot. Normal dorsalis pedis and posterior tibial pulses bilaterally with good ability to flex and extend at the ankles, dorsi and plantar flex, no signs of weakness in lower extremities. Able to walk without difficulty. Skin:  Skin is warm, dry and intact. No rash noted. Psychiatric: Mood and affect are normal. Speech and behavior are normal.  ____________________________________________   LABS (all labs ordered are listed, but only abnormal results are displayed)  Labs Reviewed  COMPREHENSIVE METABOLIC PANEL - Abnormal; Notable for the following:    Total Protein 9.1 (*)    AST 131 (*)    ALT 126 (*)    All other components within normal limits  CBC - Abnormal; Notable for the following:    WBC 3.5 (*)    RDW 16.8 (*)    Platelets 144 (*)    All other components within normal limits  URINALYSIS COMPLETEWITH MICROSCOPIC ([facility] ONLY) - Abnormal; Notable for the following:    Color, Urine YELLOW (*)    APPearance CLEAR (*)    Squamous Epithelial / LPF 0-5 (*)    All other components within normal limits  LIPASE, BLOOD   ____________________________________________  EKG  Patient does not endorse any chest or cardiac  symptoms ____________________________________________  RADIOLOGY   US Abdomen Limited RUQ (Final result) Result time: 05/12/15 22:13:21   Final result by Rad Results In Interface (05/12/15 22:13:21)   Narrative:   CLINICAL DATA: Abdominal pain  EXAM: US ABDOMEN LIMITED - RIGHT UPPER QUADRANT  COMPARISON: CT 09/26/2014  FINDINGS: Gallbladder:  No gallstones or wall thickening visualized. No sonographic Murphy sign noted.  Common bile duct:  Diameter: 4.2 mm  Liver:  Slight echogenicity of hepatic parenchyma without focal lesion. This may represent fatty infiltration.  IMPRESSION: Mildly echogenic hepatic parenchyma, suggesting mild fatty infiltration. No focal liver lesions. Normal gallbladder and bile ducts.    ____________________________________________   PROCEDURES  Procedure(s) performed: None  Critical Care performed: No  ____________________________________________   INITIAL  IMPRESSION / ASSESSMENT AND PLAN / ED COURSE  Pertinent labs & imaging results that were available during my care of the patient were reviewed by me and considered in my medical decision making (see chart for details).  Patient presents for evaluation of intermittent right sided abdominal pain that she has experienced off and on since the time of a "hysterectomy". Review of records demonstrates that she had a bilateral salpingo-oophorectomy.  She is currently denying any pelvic pain and has a very reassuring exam with only mild tenderness in the epigastric and right upper quadrant. Her labs to indicate a mild leukopenia as well as mild transaminitis. Concerning for the possibility of liver disease. When realized, discussed with the patient and she denies taking any more than 2-3, 325 mg acetaminophen tablets a day and has not taken any today. In addition, she has no known history of "hepatitis". She denies alcohol use.   Her ultrasound shows mild echogenic parenchyma of the  liver. Symptoms are concerning for possible mild hepatic disease, and I discussed with the patient. She will stop using any acetaminophen products, she will not use alcohol, and she is agreeable to schedule follow-up with Dr. Bluford Kaufmann whom I have placed a referral to the ER and talked medical record. She will also establish a primary care doctor, and we discussed that she also needs to consider having screening colonoscopy performed. At the present time, after Motrin she reports her pain and symptoms have resolved. She is awake and alert and she has no abdominal pain on repeat exam. I'll discharge her to home, and have discussed the importance of follow-up care with the patient and treatment recommendations. She is quite agreeable.  I did give her a low-dose of Neurontin which she could trial for the burning in her feet, which appears to be consistent with a possible early mild neuropathy though I have not performed clear confirmatory testing in the ER. No signs or symptoms of neurologic defect.  Specifically discussed if she develops a fever, persistent or severe pain, nausea vomiting, yellowing of skin or eyes, or other new concerns arise she'll return to the emergency room right away. ____________________________________________   FINAL CLINICAL IMPRESSION(S) / ED DIAGNOSES  Final diagnoses:  Abdominal pain  Transaminitis      Sharyn Creamer, MD 05/12/15 [phone]

## 2015-05-12 NOTE — Discharge Instructions (Signed)
You were seen in the emergency room for abdominal pain. It is important that you follow up closely by setting up a primary care doctor in the next couple of days. I also recommend and have given you a referral to see gastroenterology as I'm concerned that you have some mild inflammation in the area of her liver.  If you're unable to see her primary care doctor you may return to the emergency room or go to the [facility] in 1 or 2 days for reexam.  Do not take Tylenol or acetaminophen or alcohol containing products.  Please return to the emergency room right away if you are to develop a fever, severe nausea, your pain becomes severe or worsens, you are unable to keep food down, begin vomiting any dark or bloody fluid, you develop any dark or bloody stools, feel dehydrated, or other new concerns or symptoms arise.   Abdominal Pain, Adult Many things can cause abdominal pain. Usually, abdominal pain is not caused by a disease and will improve without treatment. It can often be observed and treated at home. Your health care provider will do a physical exam and possibly order blood tests and X-rays to help determine the seriousness of your pain. However, in many cases, more time must pass before a clear cause of the pain can be found. Before that point, your health care provider may not know if you need more testing or further treatment. HOME CARE INSTRUCTIONS Monitor your abdominal pain for any changes. The following actions may help to alleviate any discomfort you are experiencing:  Only take over-the-counter or prescription medicines as directed by your health care provider.  Do not take laxatives unless directed to do so by your health care provider.  Try a clear liquid diet (broth, tea, or water) as directed by your health care provider. Slowly move to a bland diet as tolerated. SEEK MEDICAL CARE IF:  You have unexplained abdominal pain.  You have abdominal pain associated with  nausea or diarrhea.  You have pain when you urinate or have a bowel movement.  You experience abdominal pain that wakes you in the night.  You have abdominal pain that is worsened or improved by eating food.  You have abdominal pain that is worsened with eating fatty foods.  You have a fever. SEEK IMMEDIATE MEDICAL CARE IF:  Your pain does not go away within 2 hours.  You keep throwing up (vomiting).  Your pain is felt only in portions of the abdomen, such as the right side or the left lower portion of the abdomen.  You pass bloody or black tarry stools. MAKE SURE YOU:  Understand these instructions.  Will watch your condition.  Will get help right away if you are not doing well or get worse.   This information is not intended to replace advice given to you by your health care provider. Make sure you discuss any questions you have with your health care provider.   Document Released: 03/07/2005 Document Revised: 02/16/2015 Document Reviewed: 02/04/2013 Elsevier Interactive Patient Education [employer] Inc[employer]

## 2015-05-12 NOTE — ED Notes (Addendum)
rlq abd pain, has chin lac, also has pain on bottom of feet

## 2015-06-26 ENCOUNTER — Emergency Department
Admission: EM | Admit: 2015-06-26 | Discharge: 2015-06-26 | Disposition: A | Discharge status: Home / Self Care | Payer: Self-pay | Attending: Emergency Medicine | Admitting: Emergency Medicine

## 2015-06-26 DIAGNOSIS — R05 Cough: Secondary | ICD-10-CM | POA: Insufficient documentation

## 2015-06-26 DIAGNOSIS — R197 Diarrhea, unspecified: Secondary | ICD-10-CM | POA: Insufficient documentation

## 2015-06-26 DIAGNOSIS — R110 Nausea: Secondary | ICD-10-CM | POA: Insufficient documentation

## 2015-06-26 DIAGNOSIS — Z880 Allergy status to penicillin: Secondary | ICD-10-CM | POA: Insufficient documentation

## 2015-06-26 DIAGNOSIS — Z791 Long term (current) use of non-steroidal anti-inflammatories (NSAID): Secondary | ICD-10-CM | POA: Insufficient documentation

## 2015-06-26 DIAGNOSIS — Z79899 Other long term (current) drug therapy: Secondary | ICD-10-CM | POA: Insufficient documentation

## 2015-06-26 DIAGNOSIS — Z3202 Encounter for pregnancy test, result negative: Secondary | ICD-10-CM | POA: Insufficient documentation

## 2015-06-26 DIAGNOSIS — I10 Essential (primary) hypertension: Secondary | ICD-10-CM | POA: Insufficient documentation

## 2015-06-26 DIAGNOSIS — R202 Paresthesia of skin: Secondary | ICD-10-CM | POA: Insufficient documentation

## 2015-06-26 LAB — URINALYSIS COMPLETE WITH MICROSCOPIC (ARMC ONLY)
BILIRUBIN URINE: NEGATIVE
GLUCOSE, UA: NEGATIVE mg/dL
Hgb urine dipstick: NEGATIVE
KETONES UR: NEGATIVE mg/dL
NITRITE: NEGATIVE
Protein, ur: NEGATIVE mg/dL
SPECIFIC GRAVITY, URINE: 1.017 (ref 1.005–1.030)
pH: 6 (ref 5.0–8.0)

## 2015-06-26 LAB — COMPREHENSIVE METABOLIC PANEL
ALT: 46 U/L (ref 14–54)
ANION GAP: 6 (ref 5–15)
AST: 39 U/L (ref 15–41)
Albumin: 3.2 g/dL — ABNORMAL LOW (ref 3.5–5.0)
Alkaline Phosphatase: 114 U/L (ref 38–126)
BUN: 12 mg/dL (ref 6–20)
CHLORIDE: 105 mmol/L (ref 101–111)
CO2: 25 mmol/L (ref 22–32)
Calcium: 9.3 mg/dL (ref 8.9–10.3)
Creatinine, Ser: 1.02 mg/dL — ABNORMAL HIGH (ref 0.44–1.00)
GFR calc Af Amer: >60 mL/min (ref >60)
Glucose, Bld: 123 mg/dL — ABNORMAL HIGH (ref 65–99)
POTASSIUM: 3.6 mmol/L (ref 3.5–5.1)
Sodium: 136 mmol/L (ref 135–145)
Total Bilirubin: 0.7 mg/dL (ref 0.3–1.2)
Total Protein: 8.7 g/dL — ABNORMAL HIGH (ref 6.5–8.1)

## 2015-06-26 LAB — CBC
HEMATOCRIT: 38.9 % (ref 35.0–47.0)
HEMOGLOBIN: 12.9 g/dL (ref 12.0–16.0)
MCH: 28.4 pg (ref 26.0–34.0)
MCHC: 33.1 g/dL (ref 32.0–36.0)
MCV: 85.9 fL (ref 80.0–100.0)
Platelets: 191 10*3/uL (ref 150–440)
RBC: 4.53 MIL/uL (ref 3.80–5.20)
RDW: 15.4 % — AB (ref 11.5–14.5)
WBC: 3.8 10*3/uL (ref 3.6–11.0)

## 2015-06-26 LAB — POCT PREGNANCY, URINE: PREG TEST UR: NEGATIVE

## 2015-06-26 LAB — LIPASE, BLOOD: LIPASE: 30 U/L (ref 11–51)

## 2015-06-26 MED ORDER — GABAPENTIN 300 MG PO CAPS: 300.0000 mg | ORAL_CAPSULE | Freq: Two times a day (BID) | ORAL | Status: AC

## 2015-06-26 MED ORDER — HYDROCHLOROTHIAZIDE 25 MG PO TABS: 25.0000 mg | ORAL_TABLET | Freq: Every day | ORAL | Status: AC

## 2015-06-26 MED ORDER — PROMETHAZINE HCL 25 MG PO TABS: 25.0000 mg | ORAL_TABLET | Freq: Four times a day (QID) | ORAL | Status: DC | PRN

## 2015-06-26 MED ORDER — SODIUM CHLORIDE 0.9 % IV BOLUS (SEPSIS)
1000.0000 mL | Freq: Once | INTRAVENOUS | Status: AC
  Administered 2015-06-26: 1000 mL via INTRAVENOUS

## 2015-06-26 MED ORDER — HYDROCHLOROTHIAZIDE 25 MG PO TABS
ORAL_TABLET | ORAL | Status: AC
  Administered 2015-06-26: 25 mg via ORAL
  Filled 2015-06-26: qty 1

## 2015-06-26 MED ORDER — HYDROCHLOROTHIAZIDE 25 MG PO TABS
25.0000 mg | ORAL_TABLET | Freq: Once | ORAL | Status: AC
  Administered 2015-06-26: 25 mg via ORAL

## 2015-06-26 NOTE — ED Notes (Signed)
Pt resting comfortably at this time.  Pt offered toileting, denies need to use restroom at this time.

## 2015-06-26 NOTE — ED Provider Notes (Signed)
[facility] Emergency Department Provider Note  Time seen: 5:42 PM  I have reviewed the triage vital signs and the nursing notes.   HISTORY  Chief Complaint Diarrhea    HPI Ryn D Enrique is a 55 y.o. female with a past medical history of hypertension presents the emergency department with diarrhea. According to the patient for the past 3 days she has been having frequent diarrhea. She states it is slowed today she is only had 3 bowel movements today but wanted to get checked out because she feels like she is dehydrated. She also states she continues have tingling sensations in her lower extremities she was prescribed gabapentin 1 month ago but lost her prescription. States she never had it filled. Patient also states she has had a mild cough for the past several days. Denies any fever, sputum production, shortness of breath.Describes her diarrhea as moderate, now mild. Denies abdominal pain.     Past Medical History  Diagnosis Date  . Hypertension     There are no active problems to display for this patient.   Past Surgical History  Procedure Laterality Date  . Ovarian cyst removal      Current Outpatient Rx  Name  Route  Sig  Dispense  Refill  . azithromycin (ZITHROMAX) 250 MG tablet      2 tablets today, then 1 tablet for the next 4 days.   6 each   0   . gabapentin (NEURONTIN) 300 MG capsule   Oral   Take 1 capsule (300 mg total) by mouth 2 (two) times daily.   28 capsule   0   . guaiFENesin-codeine 100-10 MG/5ML syrup   Oral   Take 10 mLs by mouth 3 (three) times daily as needed.   120 mL   0   . hydrOXYzine (VISTARIL) 25 MG capsule   Oral   Take 1 capsule (25 mg total) by mouth 3 (three) times daily as needed.   30 capsule   0   . meloxicam (MOBIC) 15 MG tablet   Oral   Take 1 tablet (15 mg total) by mouth daily.   30 tablet   0   . ranitidine (ZANTAC) 150 MG tablet   Oral   Take 1 tablet (150 mg total) by mouth 2  (two) times daily.   14 tablet   1     Allergies Penicillins  No family history on file.  Social History Social History  Substance Use Topics  . Smoking status: Never Smoker   . Smokeless tobacco: Not on file  . Alcohol Use: No    Review of Systems Constitutional: Negative for fever. Cardiovascular: Negative for chest pain. Respiratory: Negative for shortness of breath. Gastrointestinal: Negative for abdominal pain and positive for nausea, denies vomiting. Positive for diarrhea. Genitourinary: Negative for dysuria. Neurological: Negative for headache 10-point ROS otherwise negative.  ____________________________________________   PHYSICAL EXAM:  VITAL SIGNS: ED Triage Vitals  Enc Vitals Group     BP 06/26/15 1455 186/118 mmHg     Pulse Rate 06/26/15 1455 89     Resp 06/26/15 1455 18     Temp 06/26/15 1455 98.4 F (36.9 C)     Temp src --      SpO2 06/26/15 1455 98 %     Weight --      Height --      Head Cir --      Peak Flow --      Pain Score 06/26/15  1455 10     Pain Loc --      Pain Edu? --      Excl. in GC? --     Constitutional: Alert and oriented. Well appearing and in no distress. Eyes: Normal exam ENT   Head: Normocephalic and atraumatic   Mouth/Throat: Mucous membranes are moist. Cardiovascular: Normal rate, regular rhythm. No murmur Respiratory: Normal respiratory effort without tachypnea nor retractions. Breath sounds are clear and equal bilaterally. No wheezes/rales/rhonchi. Gastrointestinal: Soft and nontender. No distention.   Musculoskeletal: Nontender with normal range of motion in all extremities. No edema, no tenderness to palpation. Neurovascularly intact. Neurologic:  Normal speech and language. No gross focal neurologic deficits  Skin:  Skin is warm, dry and intact.  Psychiatric: Mood and affect are normal. Speech and behavior are normal.  ____________________________________________   INITIAL IMPRESSION / ASSESSMENT AND  PLAN / ED COURSE  Pertinent labs & imaging results that were available during my care of the patient were reviewed by me and considered in my medical decision making (see chart for details).  Overall very well-appearing patient. Presents with 3 days of diarrhea. Labs are largely within normal limits/her baseline besides a mild creatinine elevation most consistent with dehydration. We'll place an IV IV hydrate and doses Zofran in the emergency department. We will obtain a urinalysis.  Labs including urinalysis are within normal limits. Patient continues to appear well. We'll discharge home with primary care follow-up.  ____________________________________________   FINAL CLINICAL IMPRESSION(S) / ED DIAGNOSES  Diarrhea   Minna Antis, MD 06/26/15 2040

## 2015-06-26 NOTE — ED Notes (Signed)
Pt discharged to home.  Family member driving.  Discharge instructions reviewed.  Verbalized understanding.  No questions or concerns at this time.  Teach back verified.  Pt in NAD.  No items left in ED.   

## 2015-06-26 NOTE — Discharge Instructions (Signed)

## 2015-06-26 NOTE — ED Notes (Signed)
Dr. Lenard LancePaduchowski instructed to give dose of hydrochlorothiazide before patient leaves, okay to discharge after dose.

## 2015-06-26 NOTE — ED Notes (Signed)
Pt reports diarrhea x 3 days and right foot pain. Pt denies fever, n/,v. Pt states " The doctors here gave me gabapentin for my feet but i lost them"

## 2015-07-05 ENCOUNTER — Ambulatory Visit: Payer: Self-pay

## 2015-07-12 ENCOUNTER — Ambulatory Visit: Payer: Self-pay

## 2015-08-30 ENCOUNTER — Encounter: Payer: Self-pay | Admitting: *Deleted

## 2015-08-30 ENCOUNTER — Emergency Department
Admission: EM | Admit: 2015-08-30 | Discharge: 2015-08-30 | Disposition: A | Discharge status: Home / Self Care | Payer: Self-pay | Attending: Emergency Medicine | Admitting: Emergency Medicine

## 2015-08-30 ENCOUNTER — Emergency Department: Payer: Self-pay

## 2015-08-30 DIAGNOSIS — Z791 Long term (current) use of non-steroidal anti-inflammatories (NSAID): Secondary | ICD-10-CM | POA: Insufficient documentation

## 2015-08-30 DIAGNOSIS — J189 Pneumonia, unspecified organism: Secondary | ICD-10-CM | POA: Insufficient documentation

## 2015-08-30 DIAGNOSIS — I10 Essential (primary) hypertension: Secondary | ICD-10-CM | POA: Insufficient documentation

## 2015-08-30 DIAGNOSIS — R21 Rash and other nonspecific skin eruption: Secondary | ICD-10-CM | POA: Insufficient documentation

## 2015-08-30 DIAGNOSIS — Z79899 Other long term (current) drug therapy: Secondary | ICD-10-CM | POA: Insufficient documentation

## 2015-08-30 DIAGNOSIS — M25562 Pain in left knee: Secondary | ICD-10-CM | POA: Insufficient documentation

## 2015-08-30 DIAGNOSIS — G8929 Other chronic pain: Secondary | ICD-10-CM

## 2015-08-30 MED ORDER — TRIAMCINOLONE ACETONIDE 0.5 % EX OINT: 1.0000 "application " | TOPICAL_OINTMENT | Freq: Two times a day (BID) | CUTANEOUS | Status: AC

## 2015-08-30 MED ORDER — AZITHROMYCIN 250 MG PO TABS: ORAL_TABLET | ORAL | Status: AC

## 2015-08-30 MED ORDER — BENZONATATE 100 MG PO CAPS: 200.0000 mg | ORAL_CAPSULE | Freq: Three times a day (TID) | ORAL | Status: DC | PRN

## 2015-08-30 NOTE — ED Notes (Signed)
Pt has rash on bilateral arms with a cough

## 2015-08-30 NOTE — ED Notes (Addendum)
Rash to bilateral arms, see triage note.  Nonproductive cough present.

## 2015-08-30 NOTE — Discharge Instructions (Signed)
Begin antibiotic today and take as directed. Kenalog to rash on the arms as directed. Follow-up with Dr. Hyacinth MeekerMiller for any continued problems with her knee. Follow-up with [facility] for any continued problems with heel rash. Follow-up with no clinic if any continued problems and for recheck for your pneumonia.

## 2015-08-30 NOTE — ED Notes (Signed)
Pt alert and oriented X4, active, cooperative, pt in NAD. RR even and unlabored, color WNL.  Pt informed to return if any life threatening symptoms occur.   

## 2015-08-30 NOTE — ED Provider Notes (Signed)
[facility] Emergency Department Provider Note  ____________________________________________  Time seen: Approximately 12:44 PM  I have reviewed the triage vital signs and the nursing notes.   HISTORY  Chief Complaint Rash   HPI Sheila Rosales is a 55 y.o. female is here with multiple complaints. States she states that she has had a rash on bilateral arms for approximately 2 months. She states she has not put anything except for lotions at home no medication and that now it is starting to itch. She also complains of left knee pain and which she had a knee replacement done and has not followed up with her orthopedist at [facility]. She states she moved [place] and does not have an orthopedist in this area and does not plan on going back to [facility] for follow-up. She also complains of a nonproductive cough. She denies any fever or chills. She has not taken any over-the-counter medication for her cough.Patient denies any use of cigarettes.   Past Medical History  Diagnosis Date  . Hypertension     There are no active problems to display for this patient.   Past Surgical History  Procedure Laterality Date  . Ovarian cyst removal      Current Outpatient Rx  Name  Route  Sig  Dispense  Refill  . azithromycin (ZITHROMAX Z-PAK) 250 MG tablet      Take 2 tablets (500 mg) on  Day 1,  followed by 1 tablet (250 mg) once daily on Days 2 through 5.   6 each   0   . benzonatate (TESSALON PERLES) 100 MG capsule   Oral   Take 2 capsules (200 mg total) by mouth 3 (three) times daily as needed for cough.   30 capsule   0   . gabapentin (NEURONTIN) 300 MG capsule   Oral   Take 1 capsule (300 mg total) by mouth 2 (two) times daily.   28 capsule   0   . hydrochlorothiazide (HYDRODIURIL) 25 MG tablet   Oral   Take 1 tablet (25 mg total) by mouth daily.   30 tablet   2   . hydrOXYzine (VISTARIL) 25 MG capsule   Oral   Take 1 capsule (25 mg total) by  mouth 3 (three) times daily as needed.   30 capsule   0   . meloxicam (MOBIC) 15 MG tablet   Oral   Take 1 tablet (15 mg total) by mouth daily.   30 tablet   0   . triamcinolone ointment (KENALOG) 0.5 %   Topical   Apply 1 application topically 2 (two) times daily.   60 g   0     Allergies Penicillins  No family history on file.  Social History Social History  Substance Use Topics  . Smoking status: Never Smoker   . Smokeless tobacco: None  . Alcohol Use: No    Review of Systems Constitutional: No fever/chills ENT: No sore throat. Cardiovascular: Denies chest pain. Respiratory: Denies shortness of breath.Positive nonproductive cough. Gastrointestinal: No abdominal pain.  No nausea, no vomiting.  Genitourinary: Negative for dysuria. Musculoskeletal: Chronic left knee pain. Skin: Positive for skin rash 2 months. Neurological: Negative for headaches, focal weakness or numbness.  10-point ROS otherwise negative.  ____________________________________________   PHYSICAL EXAM:  VITAL SIGNS: ED Triage Vitals  Enc Vitals Group     BP 08/30/15 1221 169/90 mmHg     Pulse Rate 08/30/15 1221 100     Resp 08/30/15  1221 20     Temp 08/30/15 1221 98.2 F (36.8 C)     Temp Source 08/30/15 1221 Oral     SpO2 08/30/15 1221 99 %     Weight 08/30/15 1221 202 lb (91.627 kg)     Height 08/30/15 1221  (1.6 m)     Head Cir --      Peak Flow --      Pain Score --      Pain Loc --      Pain Edu? --      Excl. in GC? --     Constitutional: Alert and oriented. Well appearing and in no acute distress. Eyes: Conjunctivae are normal. PERRL. EOMI. Head: Atraumatic. Nose: No congestion/rhinnorhea.    Mouth/Throat: Mucous membranes are moist.  Oropharynx non-erythematous. Neck: No stridor.  Supple. Hematological/Lymphatic/Immunilogical: No cervical lymphadenopathy. Cardiovascular: Normal rate, regular rhythm. Grossly normal heart sounds.  Good peripheral  circulation. Respiratory: Normal respiratory effort.  No retractions. Lungs CTAB. No rales or rhonchi noted. Gastrointestinal: Soft and nontender. No distention.  Musculoskeletal: Moves extremities without any difficulty. Left knee with well healed surgical scar anteriorly. There is no effusion. There is some minimal tenderness on palpation anteriorly. There is no warmth or redness to the area. Neurologic:  Normal speech and language. No gross focal neurologic deficits are appreciated. No gait instability. Skin:  Skin is warm, dry and intact. Patient has nonspecific rash on bilateral arms. The areas are very dry to touch there are no pustules, erythema present. Areas are scattered. Psychiatric: Mood and affect are normal. Speech and behavior are normal.  ____________________________________________   LABS (all labs ordered are listed, but only abnormal results are displayed)  Labs Reviewed - No data to display  RADIOLOGY  Chest x-ray per radiologist shows bibasilar infiltrates. I, Tommi Rumps, personally viewed and evaluated these images (plain radiographs) as part of my medical decision making, as well as reviewing the written report by the radiologist. ____________________________________________   PROCEDURES  Procedure(s) performed: None  Critical Care performed: No  ____________________________________________   INITIAL IMPRESSION / ASSESSMENT AND PLAN / ED COURSE  Pertinent labs & imaging results that were available during my care of the patient were reviewed by me and considered in my medical decision making (see chart for details).  Patient is [facility] She is going straight over there now to get her antibiotics and medications filled. Patient states she has taken Zithromax in the past without any allergic reactions and for pneumonia. She is also given a prescription for Tessalon Perles and Kenalog cream  to apply to her rash twice a day until she can see a dermatologist. She is also to follow-up with Dr. Deeann Saint about her chronic left knee pain and make an appointment at [facility]. ____________________________________________   FINAL CLINICAL IMPRESSION(S) / ED DIAGNOSES  Final diagnoses:  Community acquired pneumonia  Rash and other nonspecific skin eruption  Chronic knee pain, left      Tommi Rumps, PA-C 08/30/15 1727  Sharman Cheek, MD 09/01/15 2247

## 2015-08-30 NOTE — ED Notes (Signed)
Pt up to bathroom.

## 2015-08-30 NOTE — ED Notes (Signed)
MD at bedside. 

## 2016-01-29 ENCOUNTER — Encounter: Payer: Self-pay | Admitting: Emergency Medicine

## 2016-01-29 ENCOUNTER — Emergency Department
Admission: EM | Admit: 2016-01-29 | Discharge: 2016-01-29 | Disposition: A | Discharge status: Home / Self Care | Payer: Self-pay | Attending: Emergency Medicine | Admitting: Emergency Medicine

## 2016-01-29 ENCOUNTER — Emergency Department: Payer: Self-pay

## 2016-01-29 DIAGNOSIS — R197 Diarrhea, unspecified: Secondary | ICD-10-CM | POA: Insufficient documentation

## 2016-01-29 DIAGNOSIS — R05 Cough: Secondary | ICD-10-CM | POA: Insufficient documentation

## 2016-01-29 DIAGNOSIS — R059 Cough, unspecified: Secondary | ICD-10-CM

## 2016-01-29 DIAGNOSIS — I10 Essential (primary) hypertension: Secondary | ICD-10-CM | POA: Insufficient documentation

## 2016-01-29 MED ORDER — LOPERAMIDE HCL 2 MG PO TABS: 2.0000 mg | ORAL_TABLET | Freq: Four times a day (QID) | ORAL | 0 refills | Status: AC | PRN

## 2016-01-29 MED ORDER — BENZONATATE 100 MG PO CAPS: 100.0000 mg | ORAL_CAPSULE | Freq: Four times a day (QID) | ORAL | 0 refills | Status: AC | PRN

## 2016-01-29 NOTE — ED Provider Notes (Signed)
[facility] Emergency Department Provider Note   ____________________________________________    I have reviewed the triage vital signs and the nursing notes.   HISTORY  Chief Complaint Cough and Diarrhea     HPI Sheila Rosales is a 55 y.o. female who presents with complaints of a cough over the last 2 days. She reports it is nonproductive and "irritating". No fevers or chills. No short of breath. No recent travel. She also complains of 4 days of intermittent diarrhea. No blood in her stool. She has not taken anything for this. She reports she frequently develops diarrhea. No abdominal pain, no nausea or vomiting   Past Medical History:  Diagnosis Date  . Hypertension     There are no active problems to display for this patient.   Past Surgical History:  Procedure Laterality Date  . OVARIAN CYST REMOVAL      Prior to Admission medications   Medication Sig Start Date End Date Taking? Authorizing Provider  azithromycin (ZITHROMAX Z-PAK) 250 MG tablet Take 2 tablets (500 mg) on  Day 1,  followed by 1 tablet (250 mg) once daily on Days 2 through 5. 08/30/15   Tommi Rumpshonda L Summers, PA-C  benzonatate (TESSALON PERLES) 100 MG capsule Take 1 capsule (100 mg total) by mouth every 6 (six) hours as needed for cough. 01/29/16 01/28/17  Jene Everyobert Suhayla Chisom, MD  gabapentin (NEURONTIN) 300 MG capsule Take 1 capsule (300 mg total) by mouth 2 (two) times daily. 06/26/15   Minna AntisKevin Paduchowski, MD  hydrochlorothiazide (HYDRODIURIL) 25 MG tablet Take 1 tablet (25 mg total) by mouth daily. 06/26/15   Minna AntisKevin Paduchowski, MD  hydrOXYzine (VISTARIL) 25 MG capsule Take 1 capsule (25 mg total) by mouth 3 (three) times daily as needed. 12/01/14   Charmayne Sheerharles M Beers, PA-C  loperamide (IMODIUM A-D) 2 MG tablet Take 1 tablet (2 mg total) by mouth 4 (four) times daily as needed for diarrhea or loose stools. 01/29/16   Jene Everyobert Kathalene Sporer, MD  meloxicam (MOBIC) 15 MG tablet Take 1 tablet (15 mg total)  by mouth daily. 04/20/15   Chinita Pesterari B Triplett, FNP  triamcinolone ointment (KENALOG) 0.5 % Apply 1 application topically 2 (two) times daily. 08/30/15   Tommi Rumpshonda L Summers, PA-C     Allergies Penicillins  History reviewed. No pertinent family history.  Social History Social History  Substance Use Topics  . Smoking status: Never Smoker  . Smokeless tobacco: Never Used  . Alcohol use No    Review of Systems  Constitutional: No fever/chills   Cardiovascular: Denies chest pain. Respiratory: Denies shortness of breath. Gastrointestinal: No abdominal pain.  No nausea, no vomiting.   Genitourinary: Negative for dysuria. Musculoskeletal: Negative for joint pain Skin: Negative for rash. Neurological: Negative for headaches   10-point ROS otherwise negative.  ____________________________________________   PHYSICAL EXAM:  VITAL SIGNS: ED Triage Vitals  Enc Vitals Group     BP 01/29/16 1416 (!) 175/98     Pulse Rate 01/29/16 1416 89     Resp 01/29/16 1554 16     Temp 01/29/16 1416 98.1 F (36.7 C)     Temp Source 01/29/16 1416 Oral     SpO2 01/29/16 1416 96 %     Weight 01/29/16 1421 215 lb 6 oz (97.7 kg)     Height 01/29/16 1416 5\' 3"  (1.6 m)     Head Circumference --      Peak Flow --      Pain Score 01/29/16 1421 10  Pain Loc --      Pain Edu? --      Excl. in GC? --     Constitutional: Alert and oriented. No acute distress. Pleasant and interactive Eyes: Conjunctivae are normal.  Head: Atraumatic. Nose: No congestion/rhinnorhea.  Cardiovascular: Normal rate, regular rhythm. Grossly normal heart sounds.  Good peripheral circulation. Respiratory: Normal respiratory effort.  No retractions. Lungs CTAB. Gastrointestinal: Soft and nontender. No distention.   Genitourinary: deferred Musculoskeletal: No lower extremity tenderness nor edema.  Warm and well perfused Neurologic:  Normal speech and language. No gross focal neurologic deficits are appreciated.  Skin:  Skin  is warm, dry and intact. No rash noted. Psychiatric: Mood and affect are normal. Speech and behavior are normal.  ____________________________________________   LABS (all labs ordered are listed, but only abnormal results are displayed)  Labs Reviewed - No data to display ____________________________________________  EKG  None ____________________________________________  RADIOLOGY  Chest x-ray unremarkable ____________________________________________   PROCEDURES  Procedure(s) performed: No    Critical Care performed: No ____________________________________________   INITIAL IMPRESSION / ASSESSMENT AND PLAN / ED COURSE  Pertinent labs & imaging results that were available during my care of the patient were reviewed by me and considered in my medical decision making (see chart for details).  Patient well-appearing and in no distress. Recommend supportive care and PCP follow-up as needed. Return precautions discussed  Clinical Course  Value Comment By Time  DG Chest 2 View CXR unremarkable  Jene Everyobert Jolleen Seman, MD 08/20 1547   ____________________________________________   FINAL CLINICAL IMPRESSION(S) / ED DIAGNOSES  Final diagnoses:  Cough  Diarrhea, unspecified type      NEW MEDICATIONS STARTED DURING THIS VISIT:  Discharge Medication List as of 01/29/2016  3:18 PM    START taking these medications   Details  loperamide (IMODIUM A-D) 2 MG tablet Take 1 tablet (2 mg total) by mouth 4 (four) times daily as needed for diarrhea or loose stools., Starting Sun 01/29/2016, Print         Note:  This document was prepared using Dragon voice recognition software and may include unintentional dictation errors.    Jene Everyobert Ione [place], MD 01/29/16 [phone]

## 2016-01-29 NOTE — ED Triage Notes (Signed)
Cough x 1 week. Diarrhea 4 days. Eating a peach during triage and advised not to eat.  Also states stomach pain on and off x 1 year since she had a cyst removed from ovary

## 2016-03-17 ENCOUNTER — Emergency Department: Payer: Self-pay

## 2016-03-17 ENCOUNTER — Encounter: Payer: Self-pay | Admitting: Emergency Medicine

## 2016-03-17 ENCOUNTER — Inpatient Hospital Stay
Admission: EM | Admit: 2016-03-17 | Discharge: 2016-04-11 | DRG: 917 | Disposition: E | Payer: Self-pay | Attending: Internal Medicine | Admitting: Internal Medicine

## 2016-03-17 ENCOUNTER — Inpatient Hospital Stay: Payer: Self-pay

## 2016-03-17 DIAGNOSIS — J9602 Acute respiratory failure with hypercapnia: Secondary | ICD-10-CM | POA: Diagnosis present

## 2016-03-17 DIAGNOSIS — I469 Cardiac arrest, cause unspecified: Secondary | ICD-10-CM

## 2016-03-17 DIAGNOSIS — K7200 Acute and subacute hepatic failure without coma: Secondary | ICD-10-CM | POA: Diagnosis present

## 2016-03-17 DIAGNOSIS — I214 Non-ST elevation (NSTEMI) myocardial infarction: Secondary | ICD-10-CM | POA: Diagnosis present

## 2016-03-17 DIAGNOSIS — Z66 Do not resuscitate: Secondary | ICD-10-CM | POA: Diagnosis not present

## 2016-03-17 DIAGNOSIS — I169 Hypertensive crisis, unspecified: Secondary | ICD-10-CM | POA: Diagnosis present

## 2016-03-17 DIAGNOSIS — E872 Acidosis: Secondary | ICD-10-CM | POA: Diagnosis present

## 2016-03-17 DIAGNOSIS — E876 Hypokalemia: Secondary | ICD-10-CM | POA: Diagnosis present

## 2016-03-17 DIAGNOSIS — J9600 Acute respiratory failure, unspecified whether with hypoxia or hypercapnia: Secondary | ICD-10-CM

## 2016-03-17 DIAGNOSIS — K922 Gastrointestinal hemorrhage, unspecified: Secondary | ICD-10-CM | POA: Diagnosis present

## 2016-03-17 DIAGNOSIS — F1410 Cocaine abuse, uncomplicated: Secondary | ICD-10-CM | POA: Diagnosis present

## 2016-03-17 DIAGNOSIS — R739 Hyperglycemia, unspecified: Secondary | ICD-10-CM | POA: Diagnosis present

## 2016-03-17 DIAGNOSIS — T405X1A Poisoning by cocaine, accidental (unintentional), initial encounter: Principal | ICD-10-CM | POA: Diagnosis present

## 2016-03-17 DIAGNOSIS — I119 Hypertensive heart disease without heart failure: Secondary | ICD-10-CM | POA: Diagnosis present

## 2016-03-17 DIAGNOSIS — Z452 Encounter for adjustment and management of vascular access device: Secondary | ICD-10-CM

## 2016-03-17 DIAGNOSIS — R570 Cardiogenic shock: Secondary | ICD-10-CM | POA: Diagnosis present

## 2016-03-17 DIAGNOSIS — J9601 Acute respiratory failure with hypoxia: Secondary | ICD-10-CM | POA: Diagnosis present

## 2016-03-17 DIAGNOSIS — G931 Anoxic brain damage, not elsewhere classified: Secondary | ICD-10-CM | POA: Diagnosis present

## 2016-03-17 DIAGNOSIS — G936 Cerebral edema: Secondary | ICD-10-CM | POA: Diagnosis present

## 2016-03-17 DIAGNOSIS — N179 Acute kidney failure, unspecified: Secondary | ICD-10-CM | POA: Diagnosis present

## 2016-03-17 DIAGNOSIS — J810 Acute pulmonary edema: Secondary | ICD-10-CM | POA: Diagnosis present

## 2016-03-17 DIAGNOSIS — Y92009 Unspecified place in unspecified non-institutional (private) residence as the place of occurrence of the external cause: Secondary | ICD-10-CM

## 2016-03-17 LAB — DIFFERENTIAL
Basophils Absolute: 0.1 10*3/uL (ref 0–0.1)
Basophils Relative: 1 %
EOS ABS: 0.1 10*3/uL (ref 0–0.7)
Eosinophils Relative: 1 %
LYMPHS ABS: 6 10*3/uL — AB (ref 1.0–3.6)
LYMPHS PCT: 67 %
MONOS PCT: 6 %
Monocytes Absolute: 0.5 10*3/uL (ref 0.2–0.9)
NEUTROS PCT: 25 %
Neutro Abs: 2.3 10*3/uL (ref 1.4–6.5)

## 2016-03-17 LAB — LIPID PANEL
CHOLESTEROL: 102 mg/dL (ref 0–200)
HDL: 28 mg/dL — AB (ref >40)
LDL CALC: 48 mg/dL (ref 0–99)
TRIGLYCERIDES: 132 mg/dL (ref <150)
Total CHOL/HDL Ratio: 3.6 RATIO
VLDL: 26 mg/dL (ref 0–40)

## 2016-03-17 LAB — CBC
HCT: 38.2 % (ref 35.0–47.0)
Hemoglobin: 11.3 g/dL — ABNORMAL LOW (ref 12.0–16.0)
MCH: 29.5 pg (ref 26.0–34.0)
MCHC: 29.6 g/dL — ABNORMAL LOW (ref 32.0–36.0)
MCV: 99.6 fL (ref 80.0–100.0)
PLATELETS: 96 10*3/uL — AB (ref 150–440)
RBC: 3.84 MIL/uL (ref 3.80–5.20)
RDW: 15 % — AB (ref 11.5–14.5)
WBC: 9 10*3/uL (ref 3.6–11.0)

## 2016-03-17 LAB — GLUCOSE, CAPILLARY
GLUCOSE-CAPILLARY: 261 mg/dL — AB (ref 65–99)
Glucose-Capillary: 59 mg/dL — ABNORMAL LOW (ref 65–99)

## 2016-03-17 LAB — COMPREHENSIVE METABOLIC PANEL
ALBUMIN: 2 g/dL — AB (ref 3.5–5.0)
ALK PHOS: 88 U/L (ref 38–126)
ALT: 155 U/L — ABNORMAL HIGH (ref 14–54)
ANION GAP: 23 — AB (ref 5–15)
AST: 228 U/L — ABNORMAL HIGH (ref 15–41)
BILIRUBIN TOTAL: 0.4 mg/dL (ref 0.3–1.2)
BUN: 15 mg/dL (ref 6–20)
CALCIUM: 7.5 mg/dL — AB (ref 8.9–10.3)
CO2: 10 mmol/L — ABNORMAL LOW (ref 22–32)
Chloride: 110 mmol/L (ref 101–111)
Creatinine, Ser: 1.28 mg/dL — ABNORMAL HIGH (ref 0.44–1.00)
GFR calc non Af Amer: 47 mL/min — ABNORMAL LOW (ref >60)
GFR, EST AFRICAN AMERICAN: 54 mL/min — AB (ref >60)
GLUCOSE: 270 mg/dL — AB (ref 65–99)
POTASSIUM: 3.5 mmol/L (ref 3.5–5.1)
Sodium: 143 mmol/L (ref 135–145)
TOTAL PROTEIN: 5.8 g/dL — AB (ref 6.5–8.1)

## 2016-03-17 LAB — PROTIME-INR
INR: 1.75
PROTHROMBIN TIME: 20.7 s — AB (ref 11.4–15.2)

## 2016-03-17 LAB — APTT: aPTT: 73 seconds — ABNORMAL HIGH (ref 24–36)

## 2016-03-17 LAB — TROPONIN I: TROPONIN I: 0.04 ng/mL — AB (ref <0.03)

## 2016-03-17 LAB — LACTIC ACID, PLASMA: Lactic Acid, Venous: 12.9 mmol/L (ref 0.5–1.9)

## 2016-03-17 MED ORDER — NOREPINEPHRINE BITARTRATE 1 MG/ML IV SOLN: 0.0000 ug/min | Freq: Once | INTRAVENOUS | Status: DC

## 2016-03-17 MED ORDER — NOREPINEPHRINE BITARTRATE 1 MG/ML IV SOLN: 2.0000 ug/min | INTRAVENOUS | Status: DC

## 2016-03-17 MED ORDER — SODIUM CHLORIDE 0.9 % IV SOLN
INTRAVENOUS | Status: DC
  Administered 2016-03-18: 01:00:00 via INTRAVENOUS

## 2016-03-17 MED ORDER — SODIUM BICARBONATE 8.4 % IV SOLN
INTRAVENOUS | Status: AC | PRN
  Administered 2016-03-17 (×3): 50 meq via INTRAVENOUS

## 2016-03-17 MED ORDER — ALBUTEROL SULFATE (2.5 MG/3ML) 0.083% IN NEBU: 2.5000 mg | INHALATION_SOLUTION | RESPIRATORY_TRACT | Status: DC | PRN

## 2016-03-17 MED ORDER — SODIUM BICARBONATE 8.4 % IV SOLN
INTRAVENOUS | Status: DC
  Administered 2016-03-17 – 2016-03-18 (×5): via INTRAVENOUS
  Filled 2016-03-17 (×12): qty 150

## 2016-03-17 MED ORDER — CALCIUM CHLORIDE 10 % IV SOLN
INTRAVENOUS | Status: AC | PRN
  Administered 2016-03-17 (×3): 1 g via INTRAVENOUS

## 2016-03-17 MED ORDER — DEXTROSE 50 % IV SOLN
INTRAVENOUS | Status: AC | PRN
  Administered 2016-03-17: 1 via INTRAVENOUS

## 2016-03-17 MED ORDER — EPINEPHRINE HCL 0.1 MG/ML IJ SOSY
PREFILLED_SYRINGE | INTRAMUSCULAR | Status: AC
  Filled 2016-03-17: qty 20

## 2016-03-17 MED ORDER — DEXTROSE 5 % IV SOLN: INTRAVENOUS | Status: AC | PRN

## 2016-03-17 MED ORDER — ONDANSETRON HCL 4 MG/2ML IJ SOLN: 4.0000 mg | Freq: Four times a day (QID) | INTRAMUSCULAR | Status: DC | PRN

## 2016-03-17 MED ORDER — SODIUM CHLORIDE 0.9 % IV SOLN
INTRAVENOUS | Status: AC | PRN
  Administered 2016-03-17 (×3): 1000 mL via INTRAVENOUS

## 2016-03-17 MED ORDER — DEXTROSE 5 % IV SOLN
0.5000 ug/min | INTRAVENOUS | Status: DC
  Administered 2016-03-17: 10 ug/min via INTRAVENOUS
  Administered 2016-03-18 (×2): 20 ug/min via INTRAVENOUS
  Administered 2016-03-18: 22 ug/min via INTRAVENOUS
  Administered 2016-03-18 (×2): 20 ug/min via INTRAVENOUS
  Filled 2016-03-17 (×9): qty 4

## 2016-03-17 MED ORDER — NOREPINEPHRINE 4 MG/250ML-% IV SOLN
2.0000 ug/min | INTRAVENOUS | Status: DC
  Administered 2016-03-17: 15 ug/min via INTRAVENOUS
  Filled 2016-03-17: qty 250

## 2016-03-17 MED ORDER — FAMOTIDINE IN NACL 20-0.9 MG/50ML-% IV SOLN: 20.0000 mg | Freq: Two times a day (BID) | INTRAVENOUS | Status: DC

## 2016-03-17 MED ORDER — NOREPINEPHRINE 4 MG/250ML-% IV SOLN: 0.0000 ug/min | INTRAVENOUS | Status: DC

## 2016-03-17 MED ORDER — ACETAMINOPHEN 325 MG PO TABS: 650.0000 mg | ORAL_TABLET | ORAL | Status: DC | PRN

## 2016-03-17 MED ORDER — SODIUM CHLORIDE 0.9 % IV SOLN: 250.0000 mL | INTRAVENOUS | Status: DC | PRN

## 2016-03-17 MED ORDER — EPINEPHRINE HCL 0.1 MG/ML IJ SOSY
PREFILLED_SYRINGE | INTRAMUSCULAR | Status: AC | PRN
  Administered 2016-03-17 (×5): 1 mg via INTRAVENOUS

## 2016-03-17 NOTE — ED Provider Notes (Signed)
[facility] Emergency Department Provider Note  ____________________________________________  Time seen: Approximately 8:45 PM  I have reviewed the triage vital signs and the nursing notes.   HISTORY  Chief Complaint Cardiac Arrest Level 5 caveat:  Portions of the history and physical were unable to be obtained due to the patient's acute illness and unresponsiveness    HPI Sheila Rosales is a 55 y.o. female brought to the ED in cardiac arrest. EMS report that the patient herself called them due to shortness of breath. As they're transporting the patient, she became unresponsive and they found her to be pulseless. They started automated chest compression device and continued that, placed a [facility] airway. They estimate CPR has been going on for 15 minutes on arrival at 19:43 PM.  Additional history obtained from the patient's self identified cousin when they arrived. They report a history of hypertension but no known allergies or other medical problems.     Past Medical History:  Diagnosis Date  . Hypertension      There are no active problems to display for this patient.    Past Surgical History:  Procedure Laterality Date  . OVARIAN CYST REMOVAL       Prior to Admission medications   Medication Sig Start Date End Date Taking? Authorizing Provider  azithromycin (ZITHROMAX Z-PAK) 250 MG tablet Take 2 tablets (500 mg) on  Day 1,  followed by 1 tablet (250 mg) once daily on Days 2 through 5. 08/30/15   Tommi Rumps, PA-C  benzonatate (TESSALON PERLES) 100 MG capsule Take 1 capsule (100 mg total) by mouth every 6 (six) hours as needed for cough. 01/29/16 01/28/17  Jene Every, MD  gabapentin (NEURONTIN) 300 MG capsule Take 1 capsule (300 mg total) by mouth 2 (two) times daily. 06/26/15   Minna Antis, MD  hydrochlorothiazide (HYDRODIURIL) 25 MG tablet Take 1 tablet (25 mg total) by mouth daily. 06/26/15   Minna Antis, MD  hydrOXYzine  (VISTARIL) 25 MG capsule Take 1 capsule (25 mg total) by mouth 3 (three) times daily as needed. 12/01/14   Charmayne Sheer Beers, PA-C  loperamide (IMODIUM A-D) 2 MG tablet Take 1 tablet (2 mg total) by mouth 4 (four) times daily as needed for diarrhea or loose stools. 01/29/16   Jene Every, MD  meloxicam (MOBIC) 15 MG tablet Take 1 tablet (15 mg total) by mouth daily. 04/20/15   Chinita Pester, FNP  triamcinolone ointment (KENALOG) 0.5 % Apply 1 application topically 2 (two) times daily. 08/30/15   Tommi Rumps, PA-C     Allergies Penicillins   History reviewed. No pertinent family history.  Social History Social History  Substance Use Topics  . Smoking status: Never Smoker  . Smokeless tobacco: Never Used  . Alcohol use No    Review of Systems Unable to obtain ____________________________________________   PHYSICAL EXAM:  VITAL SIGNS: ED Triage Vitals  Enc Vitals Group     BP --      Pulse Rate 03/16/2016 1954 (!) 112     Resp 03/12/2016 1954 (!) 23     Temp --      Temp src --      SpO2 --      Weight 04/07/2016 2002 215 lb (97.5 kg)     Height 04/02/2016 2002 5\' 5"  (1.651 m)     Head Circumference --      Peak Flow --      Pain Score --  Pain Loc --      Pain Edu? --      Excl. in GC? --     Vital signs reviewed, nursing assessments reviewed.   Constitutional:   Unresponsive. Eyes:   Dry corneas. Fixed and dilated pupils ENT   Head:   Normocephalic and atraumatic.   Nose:   No epistaxis   Mouth/Throat:   King airway in place.    Neck:   No SubQ emphysema. . Hematological/Lymphatic/Immunilogical:   No cervical lymphadenopathy. Cardiovascular:  No spontaneous pulse. Good femoral pulse with compressions.  Respiratory:   No spontaneous respiration. Symmetric breath sounds with good air entry with artificial ventilation with Brooke DareKing airway Gastrointestinal:   Soft  Non distended. No ecchymosis. Genitourinary:   No bleeding Musculoskeletal:   No  deformities. Neurologic:   Unresponsive. GCS 3  Skin:    No rash or urticaria. No wounds.  ____________________________________________    LABS (pertinent positives/negatives) (all labs ordered are listed, but only abnormal results are displayed) Labs Reviewed  GLUCOSE, CAPILLARY - Abnormal; Notable for the following:       Result Value   Glucose-Capillary 59 (*)    All other components within normal limits  PROTIME-INR - Abnormal; Notable for the following:    Prothrombin Time 20.7 (*)    All other components within normal limits  APTT - Abnormal; Notable for the following:    aPTT 73 (*)    All other components within normal limits  CBC  DIFFERENTIAL  COMPREHENSIVE METABOLIC PANEL  TROPONIN I  LIPID PANEL  TROPONIN I  TROPONIN I   ____________________________________________   EKG  Obtained after spontaneous circulation achieved. Interpreted by me Sinus tachycardia rate 112, normal axis, normal intervals. QRS. ST depression in V3 and V4. T-wave inversions in inferior and anterolateral leads consistent with global ischemia.  ____________________________________________    RADIOLOGY  Chest x-ray interpreted by me Diffuse pulmonary edema. No pneumothorax. No mediastinal shift.  ____________________________________________   PROCEDURES Procedures CRITICAL CARE Performed by: Scotty CourtSTAFFORD, Aislee Landgren   Total critical care time: 45 minutes  Critical care time was exclusive of separately billable procedures and treating other patients.  Critical care was necessary to treat or prevent imminent or life-threatening deterioration.  Critical care was time spent personally by me on the following activities: development of treatment plan with patient and/or surrogate as well as nursing, discussions with consultants, evaluation of patient's response to treatment, examination of patient, obtaining history from patient or surrogate, ordering and performing treatments and  interventions, ordering and review of laboratory studies, ordering and review of radiographic studies, pulse oximetry and re-evaluation of patient's condition.   INTUBATION Performed by: Scotty CourtSTAFFORD, Avonne Berkery  Required items: required blood products, implants, devices, and special equipment available Patient identity confirmed: provided demographic data and hospital-assigned identification number Time out: Immediately prior to procedure a "time out" was called to verify the correct patient, procedure, equipment, support staff and site/side marked as required.  Indications: Respiratory failure   Intubation method: Glidescope Laryngoscopy   Preoxygenation: BVM  Sedatives: None Paralytic: None Tube Size: 7.5 cuffed  Post-procedure assessment: chest rise and ETCO2 monitor Breath sounds: equal and absent over the epigastrium Tube secured with: ETT holder Chest x-ray interpreted by radiologist and me.  Chest x-ray findings: endotracheal tube in appropriate position  Patient tolerated the procedure well with no immediate complications.   Cardiopulmonary Resuscitation (CPR) Procedure Note Directed/Performed by: Sharman CheekSTAFFORD, Olga Bourbeau I personally directed ancillary staff and/or performed CPR in an effort to regain return of spontaneous  circulation and to maintain cardiac, neuro and systemic perfusion.  Total CPR time 30 minutes ____________________________________________   INITIAL IMPRESSION / ASSESSMENT AND PLAN / ED COURSE  Pertinent labs & imaging results that were available during my care of the patient were reviewed by me and considered in my medical decision making (see chart for details).  Patient presented in cardiac arrest. See code flow sheet for more details. Briefly, she was given ACLS algorithm for treatment with return of spontaneous circulation after about 10 minutes of treatment in the ED. However, she suffered 3 more episodes of PE A after bradycardia. Patient had positive  response to IV calcium bolus.  At 9:00 PM, patient is again becoming hypotensive blood pressure 60/30. Giving repeat calcium bolus and bicarbonate boluses. Blood gas shows pH less than 6.9 with hypercarbia and hypoxia. Deep increased. She does have evidence of pulmonary edema or pulmonary contusion which is impairing oxygen and gas exchange for sure. Troponin essentially negative.  Case discussed with family who identified herself as cousin at the bedside and with mother on the phone. Case discussed with intensive care formation ICU. Based on establish hospital protocol, patient is not a candidate for emergent catheterization due to recurrent cardiac arrest and prolonged ACLS management.  Initiation of therapeutic hypothermia was begun in the ED with ice packs on axillary and groin started about 8:40 PM.  At 9:10 PM, awaiting chemistries to further guide therapy, awaiting admission ICU. Prognosis is very poor as the patient is still unresponsive with fixed dilated pupils, and is requiring continuous epinephrine infusion to maintain spontaneous circulation.  Clinical Course    ----------------------------------------- 10:04 PM on 03-22-16 -----------------------------------------  Maintaining blood pressure and heart rate with a map of about 45. Persistently hypotensive despite continuous epinephrine infusion. We'll start norepinephrine infusion as well while awaiting transport to ICU. Persistent hypoxia. This is due to I think pulmonary edema and contusion. Attempted to increased PEEP, but this seemed to worsen hypotension and there will be a trade off with this. Currently oxygenation is been stable a 75% and will maintain current vent settings for now.  CT head discussed with radiology, who notes diffuse cerebral edema with mass effect in the left hemisphere concerning for an underlying structural lesion. Diffuse edema could be due to an anoxic injury or septic  emboli. ____________________________________________   FINAL CLINICAL IMPRESSION(S) / ED DIAGNOSES  Final diagnoses:  Cardiac arrest (HCC)  Cocaine abuse       Portions of this note were generated with dragon dictation software. Dictation errors may occur despite best attempts at proofreading.    Sharman Cheek, MD 2016/03/22 [id number]

## 2016-03-17 NOTE — Code Documentation (Signed)
Pt intubated by RT, ET tube size 8, 22cm at the lip.

## 2016-03-17 NOTE — Code Documentation (Signed)
Lucas device removed, no pulses, CPR restarted.

## 2016-03-17 NOTE — Progress Notes (Signed)
Patient brought into ed as CPR in progress.  Patient with king airway in place. Breath sounds verified by Dr Scotty CourtStafford. After patient was stabilized, patient intubated by Dr. Scotty CourtStafford 8.0 et tube 22 at lip. Positive color change noted on Co2 detector. CXR confirmed placement. Placed patient on 500vt rr16 100% peep of 5 initially.  RR changed to 20 and peep changed to 8 per Dr Scotty CourtStafford after post intubation ABG results.  Patient transferred to CT scan without difficulty.  Patient tolerated all respiratory efforts well.

## 2016-03-17 NOTE — H&P (Signed)
PULMONARY / CRITICAL CARE MEDICINE   Name: Sheila Rosales MRN: [id number] DOB: 02/08/1961    ADMISSION DATE:  Mar 20, 2016   REFERRING MD: Dr. Jene Every  CHIEF COMPLAINT: Cardiac arrest  HISTORY OF PRESENT ILLNESS:   This is a 55 y/o AA female with a PMH of hypertension who presents in cardiopulmonary arrest. History is obtained from ED/EMS records as patient is currently intubated. EMS was called to patient's home by her neighbor who indicated that patient was in respiratory distress and had been doing cocaine for three days. The neighbor also stated that patient was having hemoptysis and shortness of breath; exact duration unclear. Upon EMS arrival, patient was A&O X 4 with clear breath sounds but then she suddenly lost consciousness enroute to the ED; became pulseless and breathless. CPR was initiated and continued until ED arrival. EMS performed CPR for 15 minutes prior to arrival in the ED. A King airway was placed. In the ED, CPR was continued for 10 more minutes with return of spontaneous circulation. [facility] airway was discontinued and patient was intubated. While in the ED, patient went into cardiac arrest again 3. She was given 8 mg of narcan by EMS with no effect and 5 doses of epinephrine in the ED. Estimated total initial downtime is 45 minutes.  Post resuscitation, patient remained unresponsive. A stat CT head showed diffuse brain parenchymal edema consistent with anoxic brain injury and no so. Scattered areas of hypoattenuation in the subcortical brain parenchyma suspicious for mass lesions with some effacement of the left lateral ventricle. She remains severely hypoxic on full vent support and hypotensive on pressors.  PAST MEDICAL HISTORY :  She  has a past medical history of Hypertension.  PAST SURGICAL HISTORY: She  has a past surgical history that includes Ovarian cyst removal.  Allergies  Allergen Reactions  . Penicillins     No current facility-administered  medications on file prior to encounter.    Current Outpatient Prescriptions on File Prior to Encounter  Medication Sig  . azithromycin (ZITHROMAX Z-PAK) 250 MG tablet Take 2 tablets (500 mg) on  Day 1,  followed by 1 tablet (250 mg) once daily on Days 2 through 5.  . benzonatate (TESSALON PERLES) 100 MG capsule Take 1 capsule (100 mg total) by mouth every 6 (six) hours as needed for cough.  . gabapentin (NEURONTIN) 300 MG capsule Take 1 capsule (300 mg total) by mouth 2 (two) times daily.  . hydrochlorothiazide (HYDRODIURIL) 25 MG tablet Take 1 tablet (25 mg total) by mouth daily.  . hydrOXYzine (VISTARIL) 25 MG capsule Take 1 capsule (25 mg total) by mouth 3 (three) times daily as needed.  . loperamide (IMODIUM A-D) 2 MG tablet Take 1 tablet (2 mg total) by mouth 4 (four) times daily as needed for diarrhea or loose stools.  . meloxicam (MOBIC) 15 MG tablet Take 1 tablet (15 mg total) by mouth daily.  Marland Kitchen triamcinolone ointment (KENALOG) 0.5 % Apply 1 application topically 2 (two) times daily.  . [DISCONTINUED] promethazine (PHENERGAN) 25 MG tablet Take 1 tablet (25 mg total) by mouth every 6 (six) hours as needed for nausea or vomiting.  . [DISCONTINUED] ranitidine (ZANTAC) 150 MG tablet Take 1 tablet (150 mg total) by mouth 2 (two) times daily.    FAMILY HISTORY:  Her has no family status information on file.    SOCIAL HISTORY: She  reports that she has never smoked. She has never used smokeless tobacco. She reports that she does not  drink alcohol or use drugs.  REVIEW OF SYSTEMS:   Unable to obtain as patient is currently unresponsive and intubated  SUBJECTIVE:   VITAL SIGNS: BP (!) 76/31   Pulse 89   Resp 20   Ht 5\' 5"  (1.651 m)   Wt 215 lb (97.5 kg)   SpO2 (!) 74%   BMI 35.78 kg/m   HEMODYNAMICS:    VENTILATOR SETTINGS:    INTAKE / OUTPUT: No intake/output data recorded.  PHYSICAL EXAMINATION: General: Acutely ill-looking, pale and older for age  Neuro:  Unresponsive to voice and touch and noxious stimuli, GCS <3, no corneal reflexes HEENT: [place]/AT, Pupils are dilated, fixed and unreactive, trachea midline, ET tube Cardiovascular: Rate and rhythm regular, S1, S2, no murmurs, regurg or gallop. Gallop Lungs: Intubated, breath sounds diminished in all lung fields. No wheezing Abdomen: Obese, normal bowel sounds, palpation reveals no organomegaly, profuse bloody drainage from FEXISEAL Musculoskeletal: No visible joint deformities, positive range of motion Extremities: +2 pulses, no edema Skin: Cyanotic extremities, no rash  LABS:  BMET  Recent Labs Lab 04/09/2016 2005  NA 143  K 3.5  CL 110  CO2 10*  BUN 15  CREATININE 1.28*  GLUCOSE 270*    Electrolytes  Recent Labs Lab 03/27/2016 2005  CALCIUM 7.5*    CBC  Recent Labs Lab 04/02/2016 2005  WBC 9.0  HGB 11.3*  HCT 38.2  PLT 96*    Coag's  Recent Labs Lab 03/16/2016 2005  APTT 73*  INR 1.75    Sepsis Markers No results for input(s): LATICACIDVEN, PROCALCITON, O2SATVEN in the last 168 hours.  ABG  Recent Labs Lab 03/13/2016 2040  PHART <6.900*  PCO2ART 67*  PO2ART 53*    Liver Enzymes  Recent Labs Lab 04/04/2016 2005  AST 228*  ALT 155*  ALKPHOS 88  BILITOT 0.4  ALBUMIN 2.0*    Cardiac Enzymes  Recent Labs Lab 03/25/2016 2005  TROPONINI 0.04*    Glucose  Recent Labs Lab 03/23/2016 2014 04/06/2016 2056  GLUCAP 59* 261*    Imaging Dg Chest Port 1 View  Result Date: 04/06/2016 CLINICAL DATA:  Cardiac arrest. Respiratory distress. Cocaine use for 3 days. Hemoptysis and shortness of breath. Patient suddenly became unresponsive without palpable heartbeat during transfer to hospital. CPR. EXAM: PORTABLE CHEST 1 VIEW COMPARISON:  01/29/2016 FINDINGS: Endotracheal tube tip measures 3.6 cm above the carina. Shallow inspiration. Cardiac enlargement. Diffuse bilateral airspace disease suggesting edema or infiltration. Lucency along the mediastinal margins  may indicate pneumo mediastinum. No pneumothorax. No blunting of costophrenic angles. No visible rib fractures. IMPRESSION: Cardiac enlargement with diffuse bilateral airspace disease in the lungs. Possible pneumo mediastinum. Endotracheal tube tip measures 3.6 cm above the carina. Electronically Signed   By: Burman Nieves M.D.   On: 04/09/2016 21:12    STUDIES:  03/23/2016 2-D echo  CULTURES: 04/01/2016 Blood cultures 2. 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 Prolonged down time approximately 45 minutes, but its unclear if patient had been severely hypoxic prior to EMS being called.  CT head shows diffuse cerebral edema consistent with anoxic brain injury as well as multiple lesions suspicious for septic emboli. Currently hypothermic with temperatures between 32.9C, and 34C., Profuse bloody drainage per rectum; approximately 3.5 mL's. Based on CT head results, low temperature and prolonged down time, it was deemed that patient is not a candidate for therapeutic hypothermia and will likely not benefit from it given her guarded prognosis.  ASSESSMENT / PLAN:  PULMONARY A: Acute hypoxic and/hypercarbic respiratory failure secondary to cocaine overdose Severe metabolic acidosis Pulmonary edema P:   Full vent support Stat ABG reviewed and  vent settings adjusted Bicarbonate infusion  CARDIOVASCULAR A:  Cardiogenic shock. History of hypertension P:  IV fluids Norepinephrine and epinephrine infusions; titrate to maintain mean arterial blood pressure 65 and above Hemodynamic monitoring per ICU protocol  RENAL A:   Acute renal failure. Hypokalemia P:   Trend creatinine Renally dose medications and avoid nephrotoxic medications IV fluid resuscitation Monitor and replace electrolytes  GASTROINTESTINAL A:   Acute GI bleed-most likely lower GI; profuse loose stools mixed with bright red blood per rectum. Exact etiology unclear but most likely due to ischemic bowel with possible sloughing off the GI tract. P:   Protonix and octreotide infusions . Maintain flexiseal fecal system in place GI consultant, awaiting recommendations Trend hemoglobin and transfuse as needed. Patient is currently not stable enough for any invasive procedures  HEMATOLOGIC A:   Elevated INR-high risk for DIC P:  No pharmacologic VTE prophylaxis due to current GI bleed Monitor PT/INR  INFECTIOUS A:   No acute issues; pro-calcitonin level normal P:   Follow-up blood urine and sputum cultures Monitor fever curve  ENDOCRINE A:   Severe hyperglycemia without a diagnosis of diabetes-patient probably had undiagnosed diabetes; blood glucose levels in the 400s P:   Blood glucose monitoring with sliding scale insulin coverage. Monitor and treat hyper and hypoglycemic episodes  NEUROLOGIC A:   Acute hypoxic encephalopathy. Diffuse cerebral edema consistent with anoxic brain injury: Patient is currently not on any sedation and has no reflexes whatsoever. P:   RASS goal: 0 Overall prognosis is poor Neurologic consulted; awaiting recommendations to further discourse goals of care and CODE STATUS with patient's family   Disposition and family update:  I spoke at length with patient's mother, Mrs. Shaquoia Miers, [place]. Her oldest son,  and a handful of family members that included her nieces and her younger son. I reviewed in depth  patient's current clinical status, treatment plan, and overall prognosis. We also discussed current Patient's CODE STATUS given her prolonged downtime during the ED code as well as circumstances under which she was found and brought to the hospital. At this point, they will would like the patient to remain a full code until they can get here. The older son is the primary contact and his phone number is [phone]. He can already called for cell phone after hospital from system will not allow any calls to the 929 area code.   Total critical care time equals 120 minutes Time spent evaluating patient, establishing plan of care, communicating with treatment team and consultants= 90 minutes and 30 minutes spent, communicating plan of care and coordinating care decisions with the family  Sheila Rosales S. [facility] ANP-BC Pulmonary and Critical Care Medicine [facility] Pager [phone] or [phone] 03/15/2016, 9:37 PM

## 2016-03-17 NOTE — ED Triage Notes (Signed)
Pt presents to ED via [facility] - CPR in progress. EMS reports they were initially called out for respiratory distress. Pt's neighbor reported pt has been using cocaine for 3 days and started having hemoptysis and shortness of breath. EMS reports pt had been A&Ox4 with clear lung sounds and became unresponsive without palpable heart beat during transport to hospital. CPR started by EMS for 15min PTA. Given 8mg  narcan PTA with no response. Pt arrives with king airway in place, failed IV start x3 and failed IO start x2 PTA.

## 2016-03-17 NOTE — Code Documentation (Signed)
Family at beside. Family given emotional support. 

## 2016-03-17 NOTE — Progress Notes (Signed)
Pharmacy consult note Antibiotic renal adjustment:  Patient is not currently on any antibiotic therapy that requires renal dosing. Pharmacy will follow and manage as appropriate.  Fulton ReekMatt Oletta Buehring, PharmD, BCPS  April 02, 2016

## 2016-03-17 NOTE — Code Documentation (Signed)
Unsuccessful attempt to place Foley x2.

## 2016-03-18 ENCOUNTER — Inpatient Hospital Stay: Payer: Self-pay

## 2016-03-18 ENCOUNTER — Inpatient Hospital Stay
Admit: 2016-03-18 | Discharge: 2016-03-18 | Disposition: A | Discharge status: Home / Self Care | Payer: Self-pay | Attending: Adult Health | Admitting: Adult Health

## 2016-03-18 DIAGNOSIS — G931 Anoxic brain damage, not elsewhere classified: Secondary | ICD-10-CM

## 2016-03-18 DIAGNOSIS — I469 Cardiac arrest, cause unspecified: Secondary | ICD-10-CM

## 2016-03-18 DIAGNOSIS — J9600 Acute respiratory failure, unspecified whether with hypoxia or hypercapnia: Secondary | ICD-10-CM

## 2016-03-18 DIAGNOSIS — J9602 Acute respiratory failure with hypercapnia: Secondary | ICD-10-CM

## 2016-03-18 DIAGNOSIS — J9601 Acute respiratory failure with hypoxia: Secondary | ICD-10-CM

## 2016-03-18 LAB — CBC WITH DIFFERENTIAL/PLATELET
Basophils Absolute: 0 10*3/uL (ref 0–0.1)
Basophils Relative: 0 %
Eosinophils Absolute: 0 10*3/uL (ref 0–0.7)
Eosinophils Relative: 0 %
HCT: 42.6 % (ref 35.0–47.0)
Hemoglobin: 13.5 g/dL (ref 12.0–16.0)
Lymphocytes Relative: 16 %
Lymphs Abs: 0.6 10*3/uL — ABNORMAL LOW (ref 1.0–3.6)
MCH: 29.2 pg (ref 26.0–34.0)
MCHC: 31.8 g/dL — ABNORMAL LOW (ref 32.0–36.0)
MCV: 91.8 fL (ref 80.0–100.0)
Monocytes Absolute: 0 10*3/uL — ABNORMAL LOW (ref 0.2–0.9)
Monocytes Relative: 1 %
Neutro Abs: 3.1 10*3/uL (ref 1.4–6.5)
Neutrophils Relative %: 83 %
Platelets: 109 10*3/uL — ABNORMAL LOW (ref 150–440)
RBC: 4.64 MIL/uL (ref 3.80–5.20)
RDW: 14.2 % (ref 11.5–14.5)
WBC: 3.8 10*3/uL (ref 3.6–11.0)

## 2016-03-18 LAB — BLOOD GAS, ARTERIAL
Acid-base deficit: 6.2 mmol/L — ABNORMAL HIGH (ref 0.0–2.0)
BICARBONATE: 22.4 mmol/L (ref 20.0–28.0)
FIO2: 1
FIO2: 0.1
MECHANICAL RATE: 16
MECHVT: 500 mL
O2 Saturation: 82.1 %
PATIENT TEMPERATURE: 37
PATIENT TEMPERATURE: 37
PCO2 ART: 56 mmHg — AB (ref 32.0–48.0)
PO2 ART: 57 mmHg — AB (ref 83.0–108.0)
pCO2 arterial: 67 mmHg (ref 32.0–48.0)
pH, Arterial: <6.9 — CL (ref 7.350–7.450)
pH, Arterial: 7.21 — ABNORMAL LOW (ref 7.350–7.450)
pO2, Arterial: 53 mmHg — ABNORMAL LOW (ref 83.0–108.0)

## 2016-03-18 LAB — URINALYSIS COMPLETE WITH MICROSCOPIC (ARMC ONLY)
Bilirubin Urine: NEGATIVE
KETONES UR: NEGATIVE mg/dL
LEUKOCYTES UA: NEGATIVE
NITRITE: NEGATIVE
SPECIFIC GRAVITY, URINE: 1.006 (ref 1.005–1.030)
pH: 8 (ref 5.0–8.0)

## 2016-03-18 LAB — BASIC METABOLIC PANEL
Anion gap: 10 (ref 5–15)
BUN: 24 mg/dL — AB (ref 6–20)
CALCIUM: 8.2 mg/dL — AB (ref 8.9–10.3)
CO2: 22 mmol/L (ref 22–32)
CREATININE: 2.24 mg/dL — AB (ref 0.44–1.00)
Chloride: 104 mmol/L (ref 101–111)
GFR calc non Af Amer: 24 mL/min — ABNORMAL LOW (ref >60)
GFR, EST AFRICAN AMERICAN: 28 mL/min — AB (ref >60)
Glucose, Bld: 321 mg/dL — ABNORMAL HIGH (ref 65–99)
Potassium: <2 mmol/L — CL (ref 3.5–5.1)
SODIUM: 136 mmol/L (ref 135–145)

## 2016-03-18 LAB — GLUCOSE, CAPILLARY
GLUCOSE-CAPILLARY: 362 mg/dL — AB (ref 65–99)
GLUCOSE-CAPILLARY: 325 mg/dL — AB (ref 65–99)
GLUCOSE-CAPILLARY: 289 mg/dL — AB (ref 65–99)
GLUCOSE-CAPILLARY: 126 mg/dL — AB (ref 65–99)
Glucose-Capillary: 236 mg/dL — ABNORMAL HIGH (ref 65–99)
Glucose-Capillary: 285 mg/dL — ABNORMAL HIGH (ref 65–99)
Glucose-Capillary: 93 mg/dL (ref 65–99)

## 2016-03-18 LAB — TROPONIN I
Troponin I: 1.25 ng/mL (ref <0.03)
Troponin I: 9.66 ng/mL (ref <0.03)

## 2016-03-18 LAB — AMMONIA: Ammonia: 44 umol/L — ABNORMAL HIGH (ref 9–35)

## 2016-03-18 LAB — RAPID HIV SCREEN (HIV 1/2 AB+AG)
HIV 1/2 Antibodies: REACTIVE — AB
HIV-1 P24 Antigen - HIV24: NONREACTIVE

## 2016-03-18 LAB — CBC
HEMATOCRIT: 37.8 % (ref 35.0–47.0)
HEMOGLOBIN: 12.3 g/dL (ref 12.0–16.0)
MCH: 29.8 pg (ref 26.0–34.0)
MCHC: 32.4 g/dL (ref 32.0–36.0)
MCV: 91.8 fL (ref 80.0–100.0)
Platelets: 113 10*3/uL — ABNORMAL LOW (ref 150–440)
RBC: 4.12 MIL/uL (ref 3.80–5.20)
RDW: 14.2 % (ref 11.5–14.5)
WBC: 12.9 10*3/uL — AB (ref 3.6–11.0)

## 2016-03-18 LAB — MAGNESIUM: Magnesium: 2.1 mg/dL (ref 1.7–2.4)

## 2016-03-18 LAB — COMPREHENSIVE METABOLIC PANEL
ALK PHOS: 128 U/L — AB (ref 38–126)
ALT: 458 U/L — AB (ref 14–54)
AST: 861 U/L — AB (ref 15–41)
Albumin: 2.2 g/dL — ABNORMAL LOW (ref 3.5–5.0)
Anion gap: 11 (ref 5–15)
BUN: 19 mg/dL (ref 6–20)
CO2: 20 mmol/L — ABNORMAL LOW (ref 22–32)
CREATININE: 1.63 mg/dL — AB (ref 0.44–1.00)
Calcium: 9.1 mg/dL (ref 8.9–10.3)
Chloride: 110 mmol/L (ref 101–111)
GFR calc Af Amer: 41 mL/min — ABNORMAL LOW (ref >60)
GFR, EST NON AFRICAN AMERICAN: 35 mL/min — AB (ref >60)
GLUCOSE: 403 mg/dL — AB (ref 65–99)
Potassium: 3.1 mmol/L — ABNORMAL LOW (ref 3.5–5.1)
Sodium: 141 mmol/L (ref 135–145)
TOTAL PROTEIN: 6.3 g/dL — AB (ref 6.5–8.1)

## 2016-03-18 LAB — URINE DRUG SCREEN, QUALITATIVE (ARMC ONLY)
AMPHETAMINES, UR SCREEN: NOT DETECTED
Barbiturates, Ur Screen: NOT DETECTED
Benzodiazepine, Ur Scrn: NOT DETECTED
CANNABINOID 50 NG, UR ~~LOC~~: NOT DETECTED
COCAINE METABOLITE, UR ~~LOC~~: POSITIVE — AB
MDMA (ECSTASY) UR SCREEN: NOT DETECTED
Methadone Scn, Ur: NOT DETECTED
Opiate, Ur Screen: NOT DETECTED
Phencyclidine (PCP) Ur S: NOT DETECTED
TRICYCLIC, UR SCREEN: NOT DETECTED

## 2016-03-18 LAB — PHOSPHORUS: Phosphorus: 7.8 mg/dL — ABNORMAL HIGH (ref 2.5–4.6)

## 2016-03-18 LAB — MRSA PCR SCREENING: MRSA by PCR: NEGATIVE

## 2016-03-18 LAB — LACTIC ACID, PLASMA
LACTIC ACID, VENOUS: 3.7 mmol/L — AB (ref 0.5–1.9)
LACTIC ACID, VENOUS: 6.8 mmol/L — AB (ref 0.5–1.9)

## 2016-03-18 LAB — PROTIME-INR
INR: 1.68
Prothrombin Time: 20 seconds — ABNORMAL HIGH (ref 11.4–15.2)

## 2016-03-18 LAB — PROCALCITONIN: PROCALCITONIN: 0.25 ng/mL

## 2016-03-18 LAB — FIBRINOGEN: FIBRINOGEN: 178 mg/dL — AB (ref 210–475)

## 2016-03-18 LAB — FIBRIN DERIVATIVES D-DIMER (ARMC ONLY)

## 2016-03-18 LAB — AMYLASE: Amylase: 328 U/L — ABNORMAL HIGH (ref 28–100)

## 2016-03-18 LAB — LIPASE, BLOOD: LIPASE: 39 U/L (ref 11–51)

## 2016-03-18 MED ORDER — POTASSIUM CHLORIDE 10 MEQ/50ML IV SOLN
10.0000 meq | INTRAVENOUS | Status: AC
  Administered 2016-03-18 (×4): 10 meq via INTRAVENOUS
  Filled 2016-03-18 (×6): qty 50

## 2016-03-18 MED ORDER — PANTOPRAZOLE SODIUM 40 MG IV SOLR: 40.0000 mg | Freq: Two times a day (BID) | INTRAVENOUS | Status: DC

## 2016-03-18 MED ORDER — NOREPINEPHRINE BITARTRATE 1 MG/ML IV SOLN: 0.0000 ug/min | INTRAVENOUS | Status: DC

## 2016-03-18 MED ORDER — POTASSIUM CHLORIDE 10 MEQ/50ML IV SOLN
10.0000 meq | INTRAVENOUS | Status: AC
  Administered 2016-03-18 (×4): 10 meq via INTRAVENOUS
  Filled 2016-03-18 (×4): qty 50

## 2016-03-18 MED ORDER — SODIUM CHLORIDE 0.9 % IV SOLN
8.0000 mg/h | INTRAVENOUS | Status: DC
  Administered 2016-03-18 (×2): 8 mg/h via INTRAVENOUS
  Filled 2016-03-18 (×3): qty 80

## 2016-03-18 MED ORDER — NOREPINEPHRINE BITARTRATE 1 MG/ML IV SOLN
2.0000 ug/min | INTRAVENOUS | Status: DC
  Administered 2016-03-18: 40 ug/min via INTRAVENOUS
  Administered 2016-03-18: 225 ug/min via INTRAVENOUS
  Administered 2016-03-18: 200 ug/min via INTRAVENOUS
  Administered 2016-03-18: 44 ug/min via INTRAVENOUS
  Filled 2016-03-18 (×7): qty 16

## 2016-03-18 MED ORDER — SODIUM CHLORIDE 0.9% FLUSH: 10.0000 mL | INTRAVENOUS | Status: DC | PRN

## 2016-03-18 MED ORDER — CHLORHEXIDINE GLUCONATE 0.12% ORAL RINSE (MEDLINE KIT)
15.0000 mL | Freq: Two times a day (BID) | OROMUCOSAL | Status: DC
  Administered 2016-03-18 (×2): 15 mL via OROMUCOSAL

## 2016-03-18 MED ORDER — INSULIN ASPART 100 UNIT/ML ~~LOC~~ SOLN
0.0000 [IU] | SUBCUTANEOUS | Status: DC
  Administered 2016-03-18: 2 [IU] via SUBCUTANEOUS
  Administered 2016-03-18: 8 [IU] via SUBCUTANEOUS
  Administered 2016-03-18: 5 [IU] via SUBCUTANEOUS
  Administered 2016-03-18: 11 [IU] via SUBCUTANEOUS
  Administered 2016-03-18: 15 [IU] via SUBCUTANEOUS
  Filled 2016-03-18: qty 5
  Filled 2016-03-18: qty 3
  Filled 2016-03-18: qty 8
  Filled 2016-03-18: qty 15
  Filled 2016-03-18: qty 11

## 2016-03-18 MED ORDER — SODIUM CHLORIDE 0.9 % IV SOLN
50.0000 ug/h | INTRAVENOUS | Status: DC
  Administered 2016-03-18 (×2): 50 ug/h via INTRAVENOUS
  Filled 2016-03-18 (×5): qty 1

## 2016-03-18 MED ORDER — SODIUM CHLORIDE 0.9% FLUSH
10.0000 mL | Freq: Two times a day (BID) | INTRAVENOUS | Status: DC
  Administered 2016-03-18 (×2): 10 mL

## 2016-03-18 MED ORDER — ORAL CARE MOUTH RINSE
15.0000 mL | OROMUCOSAL | Status: DC
  Administered 2016-03-18 (×6): 15 mL via OROMUCOSAL

## 2016-03-18 MED ORDER — SODIUM CHLORIDE 0.9 % IV SOLN
80.0000 mg | Freq: Once | INTRAVENOUS | Status: AC
  Administered 2016-03-18: 80 mg via INTRAVENOUS
  Filled 2016-03-18: qty 80

## 2016-03-19 LAB — URINE CULTURE: Culture: NO GROWTH

## 2016-03-19 LAB — ECHOCARDIOGRAM COMPLETE
Height: 65 in
Weight: 3657.87 oz

## 2016-03-19 MED FILL — Medication: Qty: 1 | Status: AC

## 2016-03-20 LAB — HIV-1/2 AB - DIFFERENTIATION
HIV 1 Ab: POSITIVE — AB
HIV 2 AB: NEGATIVE

## 2016-03-21 LAB — BLOOD GAS, ARTERIAL
Acid-base deficit: 13.4 mmol/L — ABNORMAL HIGH (ref 0.0–2.0)
Bicarbonate: 17.4 mmol/L — ABNORMAL LOW (ref 20.0–28.0)
FIO2: 1
O2 SAT: 54.6 %
PCO2 ART: 63 mmHg — AB (ref 32.0–48.0)
PO2 ART: 43 mmHg — AB (ref 83.0–108.0)
Patient temperature: 37
VT: 500 mL
pH, Arterial: 7.05 — CL (ref 7.350–7.450)

## 2016-03-21 LAB — COCAINE,MS,WB/SP RFX
Benzoylecgonine: 325 ng/mL
COCAINE CONFIRMATION: POSITIVE
Cocaine: NEGATIVE ng/mL

## 2016-03-22 LAB — CULTURE, BLOOD (ROUTINE X 2)
CULTURE: NO GROWTH
Culture: NO GROWTH

## 2016-03-26 LAB — DRUG SCREEN 10 W/CONF, SERUM
AMPHETAMINES, IA: NEGATIVE ng/mL
BARBITURATES, IA: NEGATIVE ug/mL
BENZODIAZEPINES, IA: NEGATIVE ng/mL
COCAINE & METABOLITE, IA: POSITIVE ng/mL
Methadone, IA: NEGATIVE ng/mL
OPIATES, IA: NEGATIVE ng/mL
Oxycodones, IA: NEGATIVE ng/mL
Phencyclidine, IA: NEGATIVE ng/mL
Propoxyphene, IA: NEGATIVE ng/mL
THC(Marijuana) Metabolite, IA: NEGATIVE ng/mL

## 2016-03-26 LAB — OPIATES,MS,WB/SP RFX
6-Acetylmorphine: NEGATIVE
CODEINE: NEGATIVE ng/mL
DIHYDROCODEINE: NEGATIVE ng/mL
HYDROMORPHONE: NEGATIVE ng/mL
Hydrocodone: NEGATIVE ng/mL
Morphine: NEGATIVE ng/mL
Opiate Confirmation: NEGATIVE

## 2016-04-11 NOTE — Progress Notes (Signed)
Patient's troponin 9+. Dr Belia HemanKasa informed, no new orders at this time, echo being performed at bedside

## 2016-04-11 NOTE — Procedures (Signed)
Right Internal jugular Central Venous Catheter Insertion Procedure Note Sheila Rosales [id number] 12/13/1960  Procedure: Insertion of Central Venous Catheter Indications: Assessment of intravascular volume, Drug and/or fluid administration and Frequent blood sampling  Procedure Details Consent: Unable to obtain consent because of emergent medical necessity. Time Out: Verified patient identification, verified procedure, site/side was marked, verified correct patient position, special equipment/implants available, medications/allergies/relevent history reviewed, required imaging and test results available.  Performed  Maximum sterile technique was used including antiseptics, cap, gloves, gown, hand hygiene, mask and sheet. Skin prep: Chlorhexidine; local anesthetic administered A antimicrobial bonded/coated triple lumen catheter was placed in the right internal jugular vein using the Seldinger technique.  Evaluation Blood flow good Complications: No apparent complications Patient did tolerate procedure well. Chest X-ray ordered to verify placement.  CXR: normal. Procedure performed under direct supervision of Dr. Belia Rosales.  Ultrasound utilized for realtime vessel cannulation   Sheila Rosales ANP-BC Pulmonary and Critical Care Medicine [facility] Pager [phone] or [phone] 03/28/2016, 8:19 AM

## 2016-04-11 NOTE — Consult Note (Signed)
[facility]  CARDIOLOGY CONSULT NOTE  Patient ID: Sheila Rosales MRN: [id number] DOB/AGE: 52/06/1960 55 y.o.  Admit date: 04/10/2016  Reason for Consultation cardiac arrest  HPI: Pt presented with cardiac arrest.  Review of Systems  Unable to perform ROS: Intubated    Past Medical History:  Diagnosis Date  . Hypertension     History reviewed. No pertinent family history.  Social History   Social History  . Marital status: Married    Spouse name: N/A  . Number of children: N/A  . Years of education: N/A   Occupational History  . Not on file.   Social History Main Topics  . Smoking status: Never Smoker  . Smokeless tobacco: Never Used  . Alcohol use No  . Drug use: No  . Sexual activity: Not on file   Other Topics Concern  . Not on file   Social History Narrative  . No narrative on file    Past Surgical History:  Procedure Laterality Date  . OVARIAN CYST REMOVAL       Prescriptions Prior to Admission  Medication Sig Dispense Refill Last Dose  . azithromycin (ZITHROMAX Z-PAK) 250 MG tablet Take 2 tablets (500 mg) on  Day 1,  followed by 1 tablet (250 mg) once daily on Days 2 through 5. 6 each 0 Unknown at Unknown time  . benzonatate (TESSALON PERLES) 100 MG capsule Take 1 capsule (100 mg total) by mouth every 6 (six) hours as needed for cough. 20 capsule 0 Unknown at Unknown time  . gabapentin (NEURONTIN) 300 MG capsule Take 1 capsule (300 mg total) by mouth 2 (two) times daily. 28 capsule 0 Unknown at Unknown time  . hydrochlorothiazide (HYDRODIURIL) 25 MG tablet Take 1 tablet (25 mg total) by mouth daily. 30 tablet 2 Unknown at Unknown time  . hydrOXYzine (VISTARIL) 25 MG capsule Take 1 capsule (25 mg total) by mouth 3 (three) times daily as needed. 30 capsule 0 Unknown at Unknown time  . loperamide (IMODIUM A-D) 2 MG tablet Take 1 tablet (2 mg total) by mouth 4 (four) times daily as needed for diarrhea or loose stools.  30 tablet 0 Unknown at Unknown time  . meloxicam (MOBIC) 15 MG tablet Take 1 tablet (15 mg total) by mouth daily. 30 tablet 0 Unknown at Unknown time  . triamcinolone ointment (KENALOG) 0.5 % Apply 1 application topically 2 (two) times daily. 60 g 0     Physical Exam: Blood pressure (!) 82/62, pulse 89, temperature (!) 91 F (32.8 C), temperature source Core (Comment), resp. rate 20, height 5\' 5"  (1.651 m), weight 103.7 kg (228 lb 9.9 oz), SpO2 98 %.   Wt Readings from Last 1 Encounters:  11/09/2015 103.7 kg (228 lb 9.9 oz)     General appearance: intubated Chest wall: no tenderness Cardio: regular rate and rhythm Neurologic: Mental status: Intubated  Labs:   Lab Results  Component Value Date   WBC 12.9 (H) 03/28/2016   HGB 12.3 03/11/2016   HCT 37.8 04/06/2016   MCV 91.8 03/24/2016   PLT 113 (L) 03/24/2016    Recent Labs Lab 03/22/2016 2350  NA 141  K 3.1*  CL 110  CO2 20*  BUN 19  CREATININE 1.63*  CALCIUM 9.1  PROT 6.3*  BILITOT <0.1*  ALKPHOS 128*  ALT 458*  AST 861*  GLUCOSE 403*   Lab Results  Component Value Date   TROPONINI 1.25 (HH) 03/27/2016  Radiology: Head ct diffuse brain parenchymal edema c/w anoxic brain injury. Superimposed scattered areas of  hypoattenuation . CXR cardiomegaly with diffuse air space disease.  EKG: sinus tach  ASSESSMENT AND PLAN:  55 yo female with history of active cocaine abuse who presented after cardiac arrest. Currently intubated and unresponsive. No gag reflex or pupillary response. Initial troponin was 0.04 with subsequent value of 1.25. EKG sinus tachycardia with no obvious ischemia. Blood cultures negative at 12 hours. Requiring pressors. Acute renal insuffiency. Event likely secondary to cocaine abuse. Very poor prognosis. Neuro consult pending. Echo pending.  Signed: Dalia Heading MD, [facility] 03-20-2016, 10:20 AM

## 2016-04-11 NOTE — Progress Notes (Addendum)
Per Dr Clovis FredricksonKasa's conversation with son Onalee HuaDavid, we will not withdraw care at this time, continue to treat aggressively for now.  MD aware patient's O2 sats are decreasing steaditly, (currently in 70s), vent is at 100%, cannot increases PEEP d/t low BP, nothing alse we can add at this time, but continue to treat.  Pt remains Full Code.  No reflexes at all noted on assessment.  Family states they understand this.  Continue to replace electrolytes also Continuing to turn patient Q2H and provide oral care.

## 2016-04-11 NOTE — Progress Notes (Signed)
*  PRELIMINARY RESULTS* Echocardiogram 2D Echocardiogram has been performed.  Sheila Rosales 03/24/2016, 11:16 AM

## 2016-04-11 NOTE — Progress Notes (Signed)
In setting of severe brain injury and brain death, with progressive multiorgan failure, patient has no chance of meaningful recovery, I have spoken with Onalee Huaavid the Son and he asked me to speak with his Loren RacerUncle Morris and rest of the family. Patient will not survive this admission.   Onalee HuaDavid and his mother are [place] from [place] and  I have explained that patient will likely not survive by the tim ethey come.  They do not want to withdraw care but will wait for arrival. I have discussed with Loren RacerUncle Morris and he has confirmed that family has decided on DNR status.  Will place DNR status and continue medical therapy.      Lucie LeatherKurian David Rishit Burkhalter, M.D.  [facility]  Medical Director [facility] Medical Director [facility] Cardio-Pulmonary Department

## 2016-04-11 NOTE — Progress Notes (Addendum)
Pt has been made a DNR.  Attempting to maintain a BP and perfusion with levophed until her older son [place] from [place]  O2 sats slowly but steadily decreasing.  Currently in mid-low 70s  Vent on 100%, still cannot increases PEEP d/t low BP.  Have spoken with her brother Langston MaskerMorris re her declining status, he expressed understanding  Pt continues with 0 urine output, copious output into flexi seal.  Output is thin and watery, pink to red in color.  Dr Belia HemanKasa is aware..Marland Kitchen

## 2016-04-11 NOTE — Progress Notes (Addendum)
Pt map 39 despite pressors.MD does not wish to add another pressor at this time. Pt sats between 54-70's. Family was at the bedside at notified.Pt went into PEA at 2055, both RN and charge nurse were unable to palpate or dopple a pulse. NP was notified to come and pronounce. Family were appropriate for situation and supportive. RN, NP and chaplain provided emotional support. Pt pronounced dead at 2134.

## 2016-04-11 NOTE — Progress Notes (Signed)
The Chaplain rounded the unit to provide a compassionate presence and spiritual support to the patient and family. Family requested prayer which was provided and received by the family. Patient appeared to be sleeping. Jefm PettyChaplain Theadora Noyes [phone]

## 2016-04-11 NOTE — Progress Notes (Addendum)
Chaplain rounded the unit to provide a compassionate presence and support to members of the family. Patient appeared to be sleeping. Family requested prayer. Prayer was provided and received. Jefm PettyChaplain Donnamarie Shankles [phone]

## 2016-04-11 NOTE — Progress Notes (Signed)
Ms. Luci Bankukov, NP is aware of red color and mucus consistency of rectal tube output.  Discussed amount of output (see EMR for details) Hgb drawn at admission to ICU stable, no repeat labs ordered at this time, will continue to monitor pt. Closely.

## 2016-04-11 NOTE — Progress Notes (Signed)
Initial Nutrition Assessment  DOCUMENTATION CODES:   Obesity unspecified  INTERVENTION:  If medically feasible: Recommend begin Vital High Protein @ 3455mL/hr Provides 1300 calories, 114gm protein and 1087cc free water  NUTRITION DIAGNOSIS:   Inadequate oral intake related to inability to eat as evidenced by NPO status.  GOAL:   Provide needs based on ASPEN/SCCM guidelines  MONITOR:   Labs, Weight trends, I & O's, Vent status, TF tolerance  REASON FOR ASSESSMENT:   Ventilator    ASSESSMENT:   This is a 55 y/o AA female with a PMH of hypertension who presents in cardiopulmonary arrest. History is obtained from ED/EMS records as patient is currently intubated. EMS was called to patient's home by her neighbor who indicated that patient was in respiratory distress and had been doing cocaine for three days Suffered from cardiac arrest, downtime approximately 45 mins.  Patient is currently intubated on ventilator support MV: 9.8 L/min Temp (24hrs), Avg:92.1 F (33.4 C), Min:90.9 F (32.7 C), Max:95.4 F (35.2 C) Pt currently unresponsive, family at bedside, pt with good PO intake PTA Wt relatively stable x1.25 years Pt was hypotensive during my visit May not be appropriate for TFs given unresponsiveness, prognosis. Nutrition-Focused physical exam completed. Findings are no fat depletion, no muscle depletion, and no edema.  Labs and medications reviewed: CBGs 236-362, K < 2.0, Phos 7.8, Elevated LFTs Epi drip, Levo drip, Ocetrotide drip,  NaHCO3 + D5 @ 22900mL/hr --> 816 calories  Diet Order:  Diet NPO time specified  Skin:  Reviewed, no issues  Last BM:  03/20/2016  Height:   Ht Readings from Last 1 Encounters:  04/07/2016 5\' 5"  (1.651 m)    Weight:   Wt Readings from Last 1 Encounters:  2015/08/30 228 lb 9.9 oz (103.7 kg)    Ideal Body Weight:  56.81 kg  BMI:  Body mass index is 38.04 kg/m.  Estimated Nutritional Needs:   Kcal:  1140-1451 calories  Protein:   114 gm  Fluid:  >/= 1.1L  EDUCATION NEEDS:   No education needs identified at this time  Dionne AnoWilliam M. Balraj Brayfield, MS, RD LDN Inpatient Clinical Dietitian Pager [phone]

## 2016-04-11 NOTE — Progress Notes (Signed)
eLink Physician-Brief Progress Note Patient Name: Sheila ReadyDarcell D Nickolas DOB: 09/11/1960 MRN: [id number]   Date of Service  03/25/2016  HPI/Events of Note  Hypotension - BP = 76/65 in spite of large doses of IV Norepinephrine and Epinephrine.   eICU Interventions  Will order: 1. CVP now. 2. ABG now.      Intervention Category Major Interventions: Hypotension - evaluation and management  Sommer,Steven Eugene 03/21/2016, 8:10 PM

## 2016-04-11 NOTE — Progress Notes (Signed)
Discussed pt.'s Temp of 32.9 with Ms. Luci Bankukov, NP.  Will not rewarm with bear hugger at this time, will continue to monitor pt. Closely.

## 2016-04-11 NOTE — Consult Note (Signed)
Reason for Consult:Unresponsive Referring Physician: Kasa  CC: Unresponsive  HPI: Sheila Rosales is an 55 y.o. female with a history of hypertension who is unresponsive/intubated and unable to provide any history.  No family available to provide history either.  All history obtained from the chart. EMS was called to patient's home by her neighbor who indicated that patient was in respiratory distress and had been doing cocaine for three days. The neighbor also stated that patient was having hemoptysis and shortness of breath; exact duration unclear. Upon EMS arrival, patient was A&O X 4 with clear breath sounds but then she suddenly lost consciousness enroute to the ED; became pulseless and breathless. CPR was initiated and continued until ED arrival. EMS performed CPR for 15 minutes prior to arrival in the ED. A [facility] airway was placed. In the ED, CPR was continued for 10 more minutes with return of spontaneous circulation. The [facility] airway was discontinued and patient was intubated. While in the ED, patient went into cardiac arrest again 3. She was given 8 mg of narcan by EMS with no effect and 5 doses of epinephrine in the ED. Estimated total initial downtime is 45 minutes.  Post resuscitation, patient has remained unresponsive.  Is hypothermic as well.     Past Medical History:  Diagnosis Date  . Hypertension     Past Surgical History:  Procedure Laterality Date  . OVARIAN CYST REMOVAL      Family history: Unable to obtain  Social History:  reports that she has never smoked. She has never used smokeless tobacco. She reports that she does not drink alcohol or use drugs.  Allergies  Allergen Reactions  . Penicillins     Medications:  I have reviewed the patient's current medications. Prior to Admission:  Prescriptions Prior to Admission  Medication Sig Dispense Refill Last Dose  . azithromycin (ZITHROMAX Z-PAK) 250 MG tablet Take 2 tablets (500 mg) on  Day 1,  followed by 1 tablet  (250 mg) once daily on Days 2 through 5. 6 each 0 Unknown at Unknown time  . benzonatate (TESSALON PERLES) 100 MG capsule Take 1 capsule (100 mg total) by mouth every 6 (six) hours as needed for cough. 20 capsule 0 Unknown at Unknown time  . gabapentin (NEURONTIN) 300 MG capsule Take 1 capsule (300 mg total) by mouth 2 (two) times daily. 28 capsule 0 Unknown at Unknown time  . hydrochlorothiazide (HYDRODIURIL) 25 MG tablet Take 1 tablet (25 mg total) by mouth daily. 30 tablet 2 Unknown at Unknown time  . hydrOXYzine (VISTARIL) 25 MG capsule Take 1 capsule (25 mg total) by mouth 3 (three) times daily as needed. 30 capsule 0 Unknown at Unknown time  . loperamide (IMODIUM A-D) 2 MG tablet Take 1 tablet (2 mg total) by mouth 4 (four) times daily as needed for diarrhea or loose stools. 30 tablet 0 Unknown at Unknown time  . meloxicam (MOBIC) 15 MG tablet Take 1 tablet (15 mg total) by mouth daily. 30 tablet 0 Unknown at Unknown time  . triamcinolone ointment (KENALOG) 0.5 % Apply 1 application topically 2 (two) times daily. 60 g 0    Scheduled: . chlorhexidine gluconate (MEDLINE KIT)  15 mL Mouth Rinse BID  . insulin aspart  0-15 Units Subcutaneous Q4H  . mouth rinse  15 mL Mouth Rinse 10 times per day  . [START ON 03/21/2016] pantoprazole  40 mg Intravenous Q12H  . sodium chloride flush  10-40 mL Intracatheter Q12H    ROS:  Unable to obtain secondary to mental status  Physical Examination: Blood pressure (!) 81/67, pulse 86, temperature (!) 91.2 F (32.9 C), resp. rate 20, height _0  (1.651 m), weight 103.7 kg (228 lb 9.9 oz), SpO2 (!) 88 %.  HEENT-  Normocephalic, no lesions, without obvious abnormality.  Normal external eye and conjunctiva.  Normal TM's bilaterally.  Normal auditory canals and external ears. Normal external nose, mucus membranes and septum.  Normal pharynx. Cardiovascular- S1, S2 normal, pulses palpable throughout   Lungs- chest clear, no wheezing, rales, normal symmetric  air entry Abdomen- soft, non-tender; bowel sounds normal; no masses,  no organomegaly Extremities- no edema Lymph-no adenopathy palpable Musculoskeletal-no joint tenderness, deformity or swelling Skin-warm and dry, no hyperpigmentation, vitiligo, or suspicious lesions  Neurological Examination Mental Status: Patient does not respond to verbal stimuli.  Does not respond to deep sternal rub.  Does not follow commands.  No verbalizations noted.  Cranial Nerves: II: patient does not respond confrontation bilaterally, pupils right 4 mm, left 4 mm,and unreactive bilaterally III,IV,VI: doll's response absent bilaterally.  V,VII: corneal reflex absent bilaterally  VIII: patient does not respond to verbal stimuli IX,X: gag reflex unable to test, XI: trapezius strength unable to test bilaterally XII: tongue strength unable to test Motor: Extremities flaccid throughout.  No spontaneous movement noted.  No purposeful movements noted. Sensory: Does not respond to noxious stimuli in any extremity. Deep Tendon Reflexes:  1+ in the upper extremities and absent in the lower extremities Plantars: mute bilaterally Cerebellar: Unable to perform   Laboratory Studies:   Basic Metabolic Panel:  Recent Labs Lab 04/02/2016 2005 04/04/2016 2350  NA 143 141  K 3.5 3.1*  CL 110 110  CO2 10* 20*  GLUCOSE 270* 403*  BUN 15 19  CREATININE 1.28* 1.63*  CALCIUM 7.5* 9.1  MG  --  2.1  PHOS  --  7.8*    Liver Function Tests:  Recent Labs Lab 03/12/2016 2005 04/09/2016 2350  AST 228* 861*  ALT 155* 458*  ALKPHOS 88 128*  BILITOT 0.4 <0.1*  PROT 5.8* 6.3*  ALBUMIN 2.0* 2.2*    Recent Labs Lab 04/05/2016 2350  LIPASE 39  AMYLASE 328*    Recent Labs Lab 04/03/2016 2350  AMMONIA 44*    CBC:  Recent Labs Lab 03/23/2016 2005 03/24/2016 2350  WBC 9.0 12.9*  NEUTROABS 2.3  --   HGB 11.3* 12.3  HCT 38.2 37.8  MCV 99.6 91.8  PLT 96* 113*    Cardiac Enzymes:  Recent Labs Lab  03/29/2016 2005 04/06/2016 2350 03/20/2016 0934  TROPONINI 0.04* 1.25* 9.66*    BNP: Invalid input(s): POCBNP  CBG:  Recent Labs Lab March 20, 2016 0005 2016/03/20 0216 03/20/2016 0347 03/20/16 0755 03/20/2016 1210  GLUCAP 285* 362* 325* 77* 45*    Microbiology: Results for orders placed or performed during the hospital encounter of 03/20/2016  Culture, blood (routine x 2)     Status: None (Preliminary result)   Collection Time: 04/03/2016  9:51 PM  Result Value Ref Range Status   Specimen Description BLOOD LEFT FOREARM  Final   Special Requests   Final    BOTTLES DRAWN AEROBIC AND ANAEROBIC 12CCAERO,5CCANA   Culture NO GROWTH < 12 HOURS  Final   Report Status PENDING  Incomplete  Culture, blood (routine x 2)     Status: None (Preliminary result)   Collection Time: 03/11/2016 10:02 PM  Result Value Ref Range Status   Specimen Description BLOOD RIGHT FOREARM  Final   Special  Requests BOTTLES DRAWN AEROBIC AND ANAEROBIC 10CC  Final   Culture NO GROWTH < 12 HOURS  Final   Report Status PENDING  Incomplete  MRSA PCR Screening     Status: None   Collection Time: 2016/03/21  2:06 AM  Result Value Ref Range Status   MRSA by PCR NEGATIVE NEGATIVE Final    Comment:        The GeneXpert MRSA Assay (FDA approved for NASAL specimens only), is one component of a comprehensive MRSA colonization surveillance program. It is not intended to diagnose MRSA infection nor to guide or monitor treatment for MRSA infections.     Coagulation Studies:  Recent Labs  03/11/2016 2005 03/16/2016 2350  LABPROT 20.7* 20.0*  INR 1.75 1.68    Urinalysis:  Recent Labs Lab 2016-03-21 0213  COLORURINE YELLOW*  LABSPEC 1.006  PHURINE 8.0  GLUCOSEU >500*  HGBUR 3+*  BILIRUBINUR NEGATIVE  KETONESUR NEGATIVE  PROTEINUR >500*  NITRITE NEGATIVE  LEUKOCYTESUR NEGATIVE    Lipid Panel:     Component Value Date/Time   CHOL 102 03/30/2016 2005   TRIG 132 03/28/2016 2005   HDL 28 (L) 03/25/2016 2005    CHOLHDL 3.6 03/28/2016 2005   VLDL 26 04/04/2016 2005   LDLCALC 48 03/20/2016 2005    HgbA1C: No results found for: HGBA1C  Urine Drug Screen:     Component Value Date/Time   LABOPIA NONE DETECTED March 21, 2016 0213   COCAINSCRNUR POSITIVE (A) 03-21-16 0213   LABBENZ NONE DETECTED March 21, 2016 0213   AMPHETMU NONE DETECTED 03/21/16 0213   THCU NONE DETECTED 21-Mar-2016 0213   LABBARB NONE DETECTED 21-Mar-2016 0213    Alcohol Level: No results for input(s): ETH in the last 168 hours.  Other results: EKG: sinus tachycardia at 102 bpm with premature supraventricular complexes .  Imaging: Dg Abd 1 View  Result Date: 03-21-16 CLINICAL DATA:  Patient was brought to the ED with CPR in progress. Intubated. Lines placed. History of hypertension. EXAM: ABDOMEN - 1 VIEW COMPARISON:  None. FINDINGS: Enteric tube tip is in the left upper quadrant consistent with location in the upper stomach. Gas and stool in the colon without distention. Old left rib fractures. IMPRESSION: Enteric tube tip is in the left upper quadrant consistent with location in the upper stomach. Electronically Signed   By: Lucienne Capers M.D.   On: 21-Mar-2016 01:25   Ct Head Wo Contrast  Result Date: 03/11/2016 CLINICAL DATA:  Hemoptysis and shortness of breath. Patient became unresponsive in route to the hospital. CPR was performed. EXAM: CT HEAD WITHOUT CONTRAST TECHNIQUE: Contiguous axial images were obtained from the base of the skull through the vertex without intravenous contrast. COMPARISON:  None. FINDINGS: Brain: There is diffuse effacement of sulci architecture and gray-white matter differentiation throughout the brain, suspicious for diffuse brain edema. There are scattered areas of hypoattenuation throughout the subcortical gray and white-matter, particularly prominent in the left frontal lobe, where there is effacement of the left lateral ventricle. This appearance is concerning for superimposed mass lesions  producing mass effect on the ventricle. The hyperdense appearance of the intracranial vessels and membranes is likely artifactual, in relation to the hypoattenuated appearance of the brain parenchyma. No evidence of herniation. Vascular: Diffusely hyperdense appearance of the intracranial vessels. Skull: Normal. Negative for fracture or focal lesion. Sinuses/Orbits: Frothy material fills the dependent portion of the ethmoid sinuses. The remainder of the paranasal sinuses and mastoid air cells are well aerated. Other: None. IMPRESSION: Diffuse brain parenchymal edema, which may  be seen with anoxic brain injury. Scattered areas of hypoattenuation throughout the subcortical brain parenchymal is concerning for superimposed mass lesions. This process is particularly prominent in the left frontal lobe, where there is effacement of the left lateral ventricle. Septic emboli or other scattered mass lesions, not well visualized by CT are of consideration. Critical Value/emergent results were called by telephone at the time of interpretation on 03/21/2016 at 9:56 pm to Dr. Joni Fears, who verbally acknowledged these results. Electronically Signed   By: Fidela Salisbury M.D.   On: 04/06/2016 22:00   Dg Chest Port 1 View  Result Date: 04-13-2016 CLINICAL DATA:  CPR in progress upon admission to the ED. Line placements. History of hypertension. EXAM: PORTABLE CHEST 1 VIEW COMPARISON:  03/21/2016 FINDINGS: Endotracheal tube with tip measuring 3.6 cm above the carina. Right central venous catheter has been placed with tip over the low SVC region. No pneumothorax. Enteric tube tip is in the left upper quadrant consistent with location in the upper stomach. Cardiac enlargement with diffuse bilateral airspace infiltrates in the lungs. This could indicate edema, pneumonia, or ARDS. No changes to suggest pneumo mediastinum. IMPRESSION: Appliances appear in satisfactory location. Cardiac enlargement with diffuse bilateral airspace  disease. Electronically Signed   By: Lucienne Capers M.D.   On: Apr 13, 2016 01:27   Dg Chest Port 1 View  Result Date: 03/27/2016 CLINICAL DATA:  Cardiac arrest. Respiratory distress. Cocaine use for 3 days. Hemoptysis and shortness of breath. Patient suddenly became unresponsive without palpable heartbeat during transfer to hospital. CPR. EXAM: PORTABLE CHEST 1 VIEW COMPARISON:  01/29/2016 FINDINGS: Endotracheal tube tip measures 3.6 cm above the carina. Shallow inspiration. Cardiac enlargement. Diffuse bilateral airspace disease suggesting edema or infiltration. Lucency along the mediastinal margins may indicate pneumo mediastinum. No pneumothorax. No blunting of costophrenic angles. No visible rib fractures. IMPRESSION: Cardiac enlargement with diffuse bilateral airspace disease in the lungs. Possible pneumo mediastinum. Endotracheal tube tip measures 3.6 cm above the carina. Electronically Signed   By: Lucienne Capers M.D.   On: 03/21/2016 21:12     Assessment/Plan: 55 year old with a history of drug abuse who presents s/p arrest.  Patient currently unresponsive.  Not breathing above the ventilator.  By exam fulfills criteria for brain death but hypothermic so therefore unable to declare so.  Head CT personally reviewed and shows evidence of diffuse edema that suggests anoxic brain injury which is consistent with the period of time required for the patient to return to ROSC.  Remains hypotensive requiring pressors.  Shows evidence of cardiac and hepatic injury.  Prognosis is extremely poor for the occurrence of a functional recovery.  Case discussed with Dr. Mortimer Fries.  Awaiting family for further discussions.     Alexis Goodell, MD Neurology [phone] 04-13-16, 12:48 PM

## 2016-04-11 NOTE — Progress Notes (Signed)
[facility] and assumed care of pt. At this time.

## 2016-04-11 NOTE — Discharge Summary (Signed)
PULMONARY / CRITICAL CARE MEDICINE   Name: Sheila Rosales MRN: [id number] DOB: 1960-12-15    ADMISSION DATE:  03/25/2016   REFERRING MD: Dr. Jene Every  CHIEF COMPLAINT: Cardiac arrest  ADMISSION DIAGNOSIS   Cardiopulmonary arrest secondary to cocaine overdose Multiorgan failure  Acute GI bleed Cardiogenic shock Cocaine overdose Severe metabolic acidosis  Discharge diagnosis Cardiopulmonary arrest secondary to cocaine overdose Multiorgan failure  Acute GI bleed Cardiogenic shock Cocaine overdose Severe metabolic acidosis   HISTORY OF PRESENT ILLNESS:   This is a 55 y/o AA female with a PMH of hypertension who presents in cardiopulmonary arrest. History is obtained from ED/EMS records as patient is currently intubated. EMS was called to patient's home by her neighbor who indicated that patient was in respiratory distress and had been doing cocaine for three days. The neighbor also stated that patient was having hemoptysis and shortness of breath; exact duration unclear. Upon EMS arrival, patient was A&O X 4 with clear breath sounds but then she suddenly lost consciousness enroute to the ED; became pulseless and breathless. CPR was initiated and continued until ED arrival. EMS performed CPR for 15 minutes prior to arrival in the ED. A [facility] airway was placed. In the ED, CPR was continued for 10 more minutes with return of spontaneous circulation. The [facility] airway was discontinued and patient was intubated. While in the ED, patient went into cardiac arrest again 3. She was given 8 mg of narcan by EMS with no effect and 5 doses of epinephrine in the ED. Estimated total initial downtime is 45 minutes.  Post resuscitation, patient remained unresponsive. A stat CT head showed diffuse brain parenchymal edema consistent with anoxic brain injury and no so. Scattered areas of hypoattenuation in the subcortical brain parenchyma suspicious for mass lesions with some effacement of the left lateral  ventricle. She remains severely hypoxic on full vent support and hypotensive on pressors.  PAST MEDICAL HISTORY :  She  has a past medical history of Hypertension.  PAST SURGICAL HISTORY: She  has a past surgical history that includes Ovarian cyst removal.  Allergies  Allergen Reactions  . Penicillins     No current facility-administered medications on file prior to encounter.    Current Outpatient Prescriptions on File Prior to Encounter  Medication Sig  . azithromycin (ZITHROMAX Z-PAK) 250 MG tablet Take 2 tablets (500 mg) on  Day 1,  followed by 1 tablet (250 mg) once daily on Days 2 through 5.  . benzonatate (TESSALON PERLES) 100 MG capsule Take 1 capsule (100 mg total) by mouth every 6 (six) hours as needed for cough.  . gabapentin (NEURONTIN) 300 MG capsule Take 1 capsule (300 mg total) by mouth 2 (two) times daily.  . hydrochlorothiazide (HYDRODIURIL) 25 MG tablet Take 1 tablet (25 mg total) by mouth daily.  . hydrOXYzine (VISTARIL) 25 MG capsule Take 1 capsule (25 mg total) by mouth 3 (three) times daily as needed.  . loperamide (IMODIUM A-D) 2 MG tablet Take 1 tablet (2 mg total) by mouth 4 (four) times daily as needed for diarrhea or loose stools.  . meloxicam (MOBIC) 15 MG tablet Take 1 tablet (15 mg total) by mouth daily.  Marland Kitchen triamcinolone ointment (KENALOG) 0.5 % Apply 1 application topically 2 (two) times daily.  . [DISCONTINUED] promethazine (PHENERGAN) 25 MG tablet Take 1 tablet (25 mg total) by mouth every 6 (six) hours as needed for nausea or vomiting.  . [DISCONTINUED] ranitidine (ZANTAC) 150 MG tablet Take 1 tablet (150  mg total) by mouth 2 (two) times daily.    FAMILY HISTORY:  Her has no family status information on file.    SOCIAL HISTORY: She  reports that she has never smoked. She has never used smokeless tobacco. She reports that she does not drink alcohol or use drugs.  REVIEW OF SYSTEMS:   Unable to obtain as patient is currently unresponsive and  intubated  SUBJECTIVE:   VITAL SIGNS: BP (!) 76/65   Pulse (!) 120   Temp (!) 102.2 F (39 C)   Resp 20   Ht 5\' 5"  (1.651 m)   Wt 228 lb 9.9 oz (103.7 kg)   SpO2 (!) 67%   BMI 38.04 kg/m   HEMODYNAMICS:    VENTILATOR SETTINGS: Vent Mode: PRVC FiO2 (%):  [100 %] 100 % Set Rate:  [20 bmp] 20 bmp Vt Set:  [500 mL] 500 mL PEEP:  [8 cmH20] 8 cmH20 Plateau Pressure:  [26 cmH20-29 cmH20] 29 cmH20  INTAKE / OUTPUT: I/O last 3 completed shifts: In: 12924.5 [I.V.:12824.5; IV Piggyback:100] Out: 6510 [Urine:10; Stool:6500]  PHYSICAL EXAMINATION: General: Acutely ill-looking, pale and older for age  Neuro: Unresponsive to voice and touch and noxious stimuli, GCS <3, no corneal reflexes HEENT: [place]/AT, Pupils are dilated, fixed and unreactive, trachea midline, ET tube Cardiovascular: Rate and rhythm regular, S1, S2, no murmurs, regurg or gallop. Gallop Lungs: Intubated, breath sounds diminished in all lung fields. No wheezing Abdomen: Obese, normal bowel sounds, palpation reveals no organomegaly, profuse bloody drainage from FEXISEAL Musculoskeletal: No visible joint deformities, positive range of motion Extremities: +2 pulses, no edema Skin: Cyanotic extremities, no rash  LABS:  BMET  Recent Labs Lab 03/21/2016 2005 03/31/2016 2350 07/04/15 0934  NA 143 141 136  K 3.5 3.1* <2.0*  CL 110 110 104  CO2 10* 20* 22  BUN 15 19 24*  CREATININE 1.28* 1.63* 2.24*  GLUCOSE 270* 403* 321*    Electrolytes  Recent Labs Lab 04/04/2016 2005 03/21/2016 2350 07/04/15 0934  CALCIUM 7.5* 9.1 8.2*  MG  --  2.1  --   PHOS  --  7.8*  --     CBC  Recent Labs Lab 03/12/2016 2005 03/16/2016 2350 07/04/15 0934  WBC 9.0 12.9* 3.8  HGB 11.3* 12.3 13.5  HCT 38.2 37.8 42.6  PLT 96* 113* 109*    Coag's  Recent Labs Lab 04/10/2016 2005 03/21/2016 2350  APTT 73*  --   INR 1.75 1.68    Sepsis Markers  Recent Labs Lab 04/01/2016 2157 03/26/2016 2350 07/04/15 0206  LATICACIDVEN 12.9*  6.8* 3.7*  PROCALCITON  --  0.25  --     ABG  Recent Labs Lab 04/08/2016 2040 04/05/2016 2300 07/04/15 0500  PHART <6.900* PENDING 7.21*  PCO2ART 67* 63* 56*  PO2ART 53* PENDING 57*    Liver Enzymes  Recent Labs Lab 03/23/2016 2005 03/22/2016 2350  AST 228* 861*  ALT 155* 458*  ALKPHOS 88 128*  BILITOT 0.4 <0.1*  ALBUMIN 2.0* 2.2*    Cardiac Enzymes  Recent Labs Lab 04/03/2016 2005 03/25/2016 2350 07/04/15 0934  TROPONINI 0.04* 1.25* 9.66*    Glucose  Recent Labs Lab 07/04/15 0216 07/04/15 0347 07/04/15 0755 07/04/15 1210 07/04/15 1620 07/04/15 2008  GLUCAP 362* 325* 289* 236* 126* 93    Imaging Dg Abd 1 View  Result Date: 04/01/2016 CLINICAL DATA:  Patient was brought to the ED with CPR in progress. Intubated. Lines placed. History of hypertension. EXAM: ABDOMEN - 1 VIEW COMPARISON:  None. FINDINGS: Enteric tube tip is in the left upper quadrant consistent with location in the upper stomach. Gas and stool in the colon without distention. Old left rib fractures. IMPRESSION: Enteric tube tip is in the left upper quadrant consistent with location in the upper stomach. Electronically Signed   By: Burman Nieves M.D.   On: Mar 23, 2016 01:25   Dg Chest Port 1 View  Result Date: 2016/03/23 CLINICAL DATA:  CPR in progress upon admission to the ED. Line placements. History of hypertension. EXAM: PORTABLE CHEST 1 VIEW COMPARISON:  03/19/2016 FINDINGS: Endotracheal tube with tip measuring 3.6 cm above the carina. Right central venous catheter has been placed with tip over the low SVC region. No pneumothorax. Enteric tube tip is in the left upper quadrant consistent with location in the upper stomach. Cardiac enlargement with diffuse bilateral airspace infiltrates in the lungs. This could indicate edema, pneumonia, or ARDS. No changes to suggest pneumo mediastinum. IMPRESSION: Appliances appear in satisfactory location. Cardiac enlargement with diffuse bilateral airspace disease.  Electronically Signed   By: Burman Nieves M.D.   On: 2016/03/23 01:27    STUDIES:  03/24/2016 2-D echo  CULTURES: 04/04/2016 Blood cultures 2. 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 Prolonged down time approximately 45 minutes, but its unclear if patient had been severely hypoxic prior to EMS being called. CT head shows diffuse cerebral edema consistent with anoxic brain injury as well as multiple lesions suspicious for septic emboli. Currently hypothermic with temperatures between 32.9C, and 34C., Profuse bloody drainage per rectum; approximately 3.5 mL's. Based on CT head results, low temperature and prolonged down time, it was deemed that patient is not a candidate for therapeutic hypothermia and will likely not benefit from it given her guarded prognosis.  HOSPITAL COURSE  PULMONARY A: Acute hypoxic and/hypercarbic respiratory failure secondary to cocaine overdose Severe metabolic acidosis Pulmonary edema P:   Full vent support Stat ABG reviewed and vent settings adjusted Bicarbonate infusion  CARDIOVASCULAR A:  Cardiogenic shock. History of hypertension P:  IV fluids Norepinephrine and epinephrine infusions; titrate to maintain mean arterial blood pressure 65 and above Hemodynamic monitoring per ICU protocol  RENAL A:    Acute renal failure. Hypokalemia P:   Trend creatinine Renally dose medications and avoid nephrotoxic medications IV fluid resuscitation Monitor and replace electrolytes  GASTROINTESTINAL A:   Acute GI bleed-most likely lower GI; profuse loose stools mixed with bright red blood per rectum. Exact etiology unclear but most likely due to ischemic bowel with possible sloughing off the GI tract. P:   Protonix and octreotide infusions . Maintain flexiseal fecal system in place GI consultant, awaiting recommendations Trend hemoglobin and transfuse as needed. Patient is currently not stable enough for any invasive procedures  HEMATOLOGIC A:   Elevated INR-high risk for DIC P:  No pharmacologic VTE prophylaxis due to current GI bleed Monitor PT/INR  INFECTIOUS A:   No acute issues; pro-calcitonin level normal P:   Blood cultures and broad spectrum antibiotics Monitor fever curve  ENDOCRINE A:  Severe hyperglycemia without a diagnosis of diabetes-patient probably had undiagnosed diabetes; blood glucose levels in the 400s P:   Blood glucose monitoring with sliding scale insulin coverage. Monitor and treat hyper and hypoglycemic episodes  NEUROLOGIC A:   Acute hypoxic encephalopathy. Diffuse cerebral edema consistent with anoxic brain injury: Patient is currently not on any sedation and has no reflexes whatsoever. P:   RASS goal: 0 Overall prognosis is poor Neurologic was consulted and they recommended palliative care due to CT results and exam.   Disposition: After lengthy discussions with family, patient was made DNR/DNI and patient expired at 2134. Family at bedside. Support provided    [employer]. [facility] ANP-BC Pulmonary and Critical Care Medicine [facility] Pager [phone] or [phone] 23-Mar-2016, 10:13 PM

## 2016-04-11 DEATH — deceased

## 2017-10-10 IMAGING — CT CT HEAD W/O CM
3 series · 15 of 46 positions shown, 18 images · non-contrast
Comparison: None.

CLINICAL DATA: Hemoptysis and shortness of breath. Patient became
unresponsive in route to the hospital. CPR was performed.

EXAM:
CT HEAD WITHOUT CONTRAST
TECHNIQUE: Contiguous axial images were obtained from the base of the skull
through the vertex without intravenous contrast.

[Series 2: head wo · axial · 0.47mm/px · z∈[-135,-15]mm · 9 of 29 slices shown, 12 images]
[im 3/29  brain]
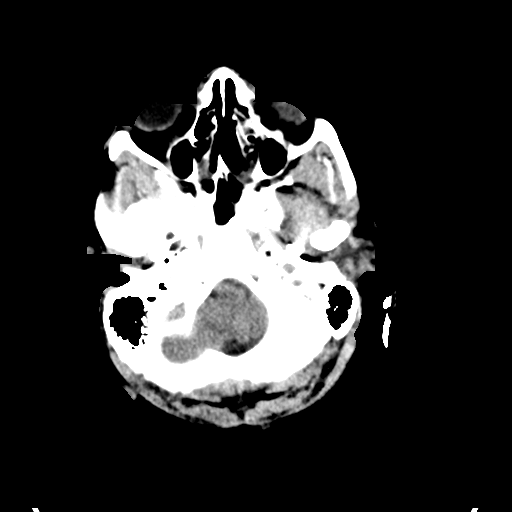
[im 3/29  bone]
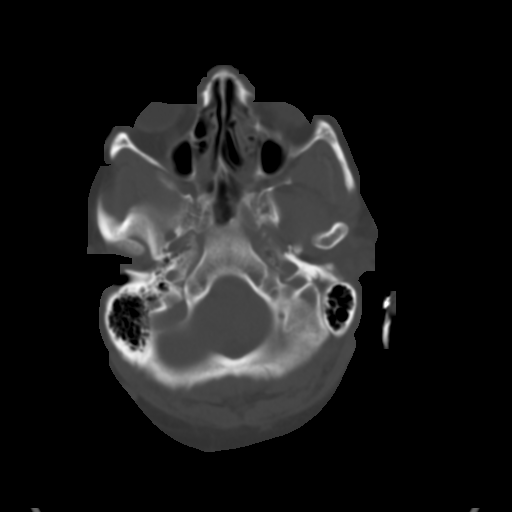
[im 6/29  brain]
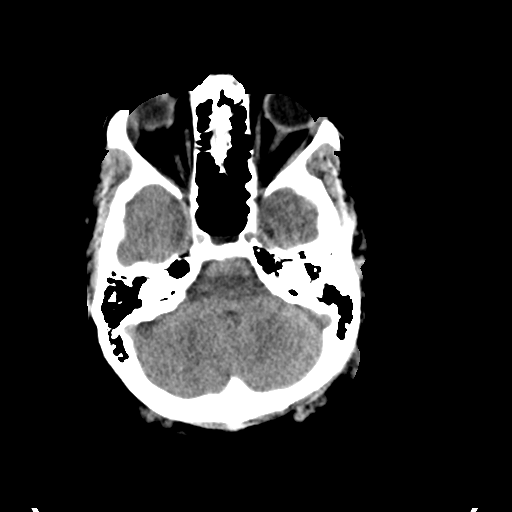
[im 9/29  brain]
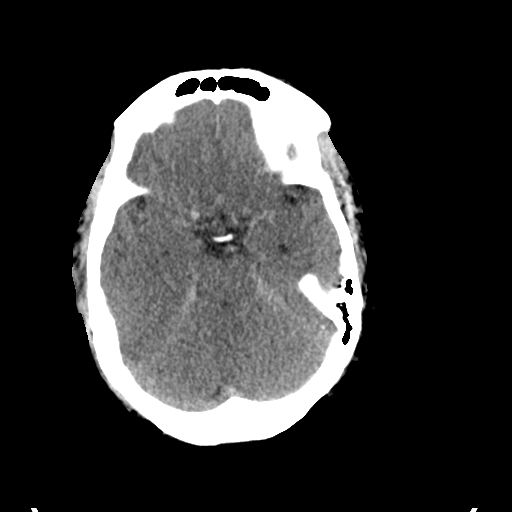
[im 12/29  brain]
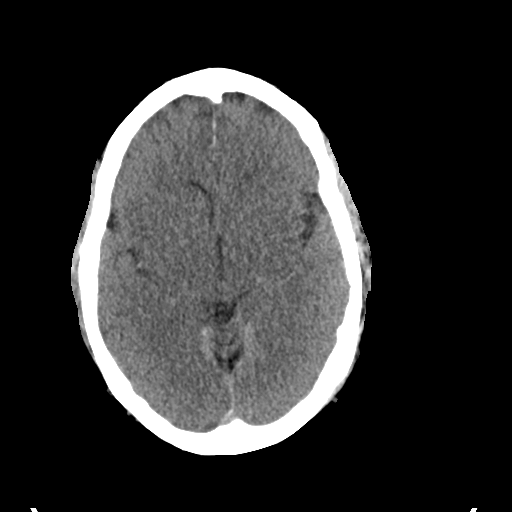
[im 15/29  brain]
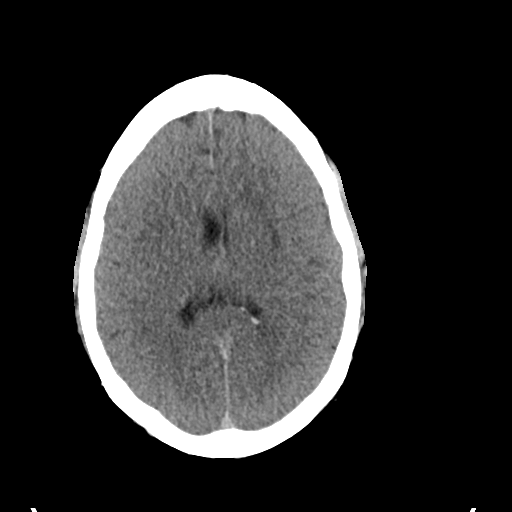
[im 15/29  bone]
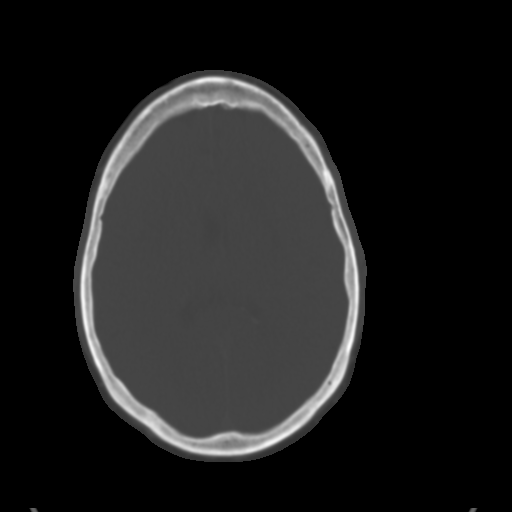
[im 18/29  brain]
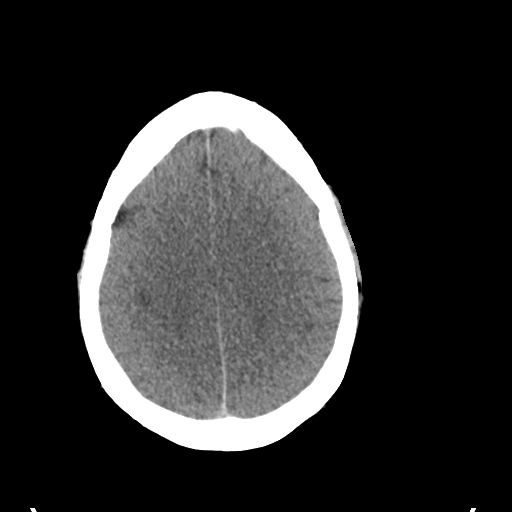
[im 21/29  brain]
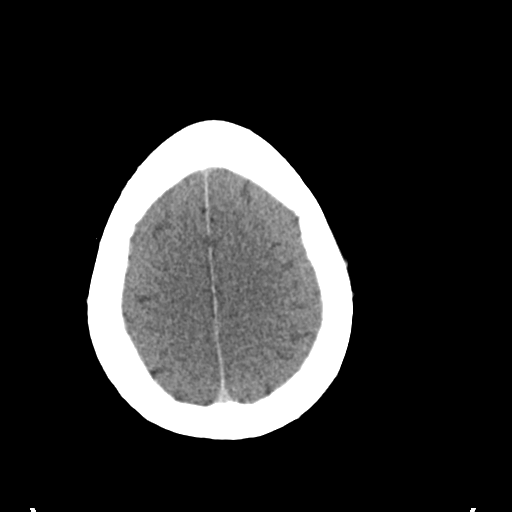
[im 24/29  brain]
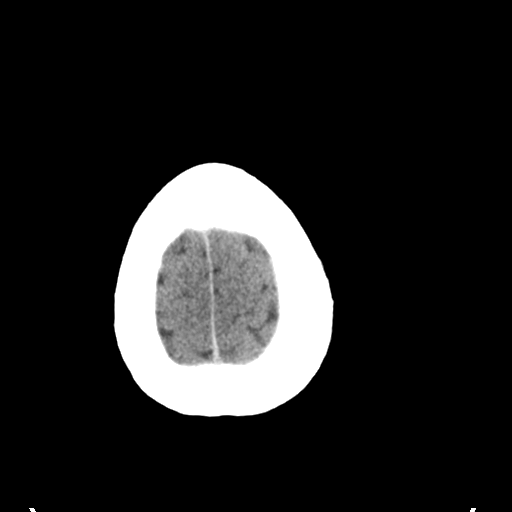
[im 27/29  brain]
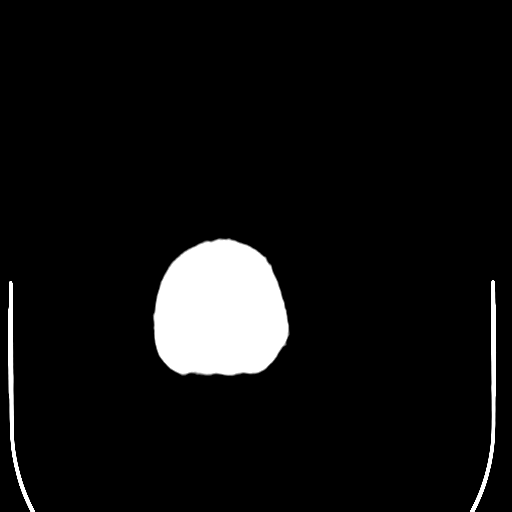
[im 27/29  bone]
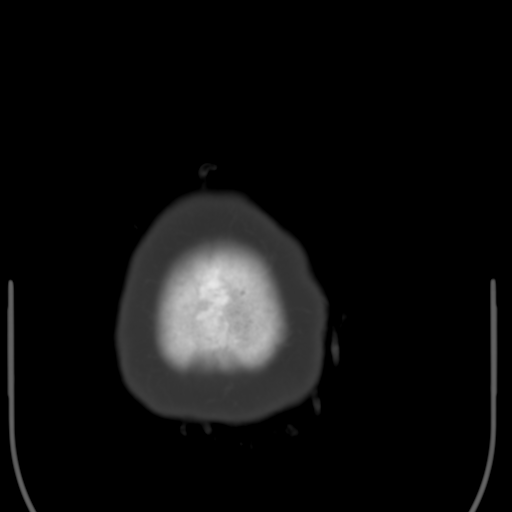

[Series 4: coronal soft tissue · coronal · 0.29mm/px · 3 of 60 slices shown]
[im 20/60  brain]
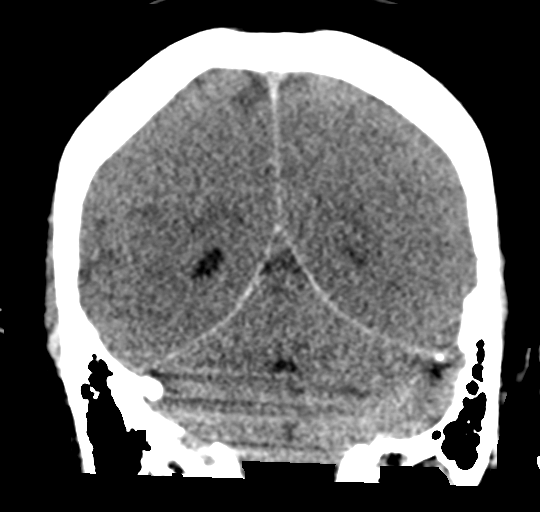
[im 27/60  brain]
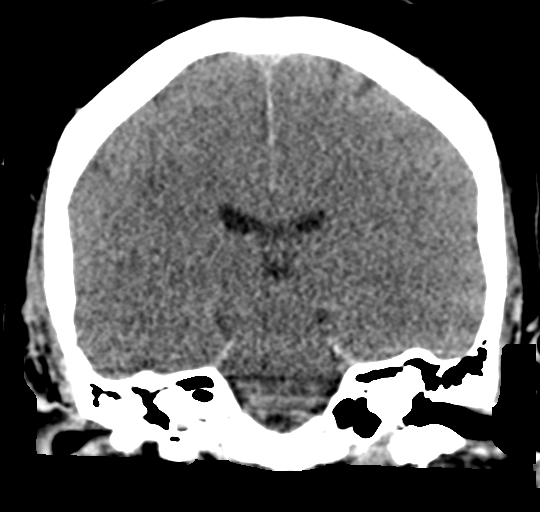
[im 33/60  brain]
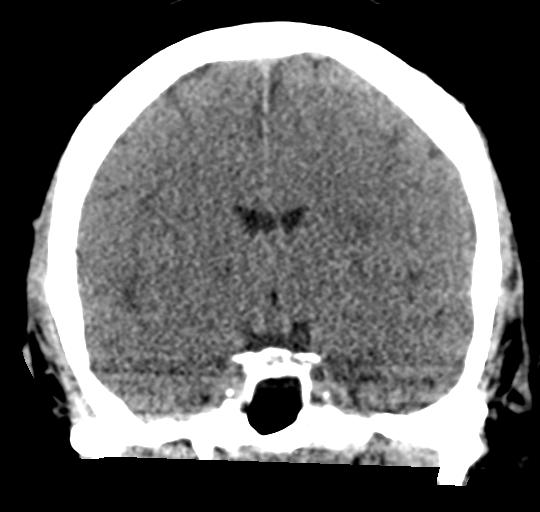

[Series 5: sagittal soft tissue · sagittal · 0.27mm/px · 3 of 47 slices shown]
[im 16/47  brain]
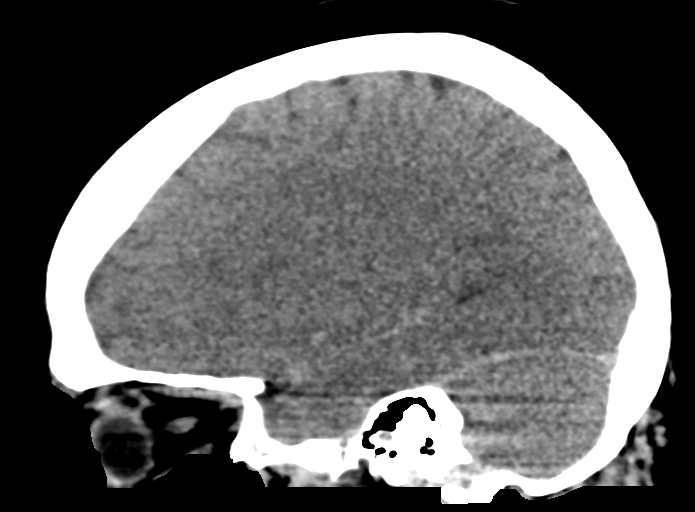
[im 24/47  brain]
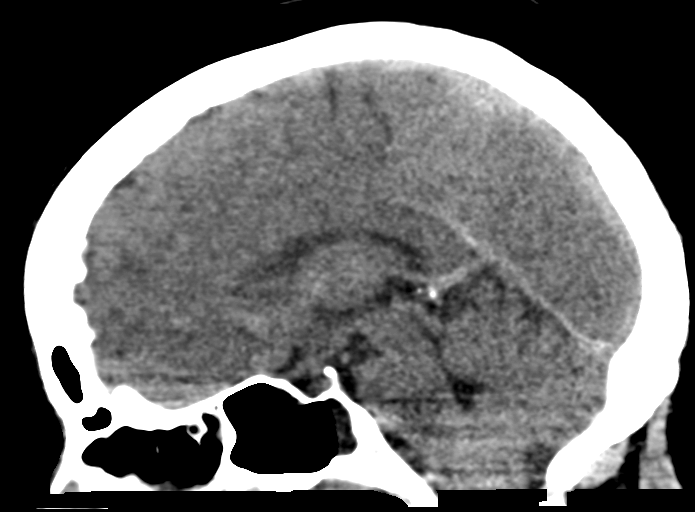
[im 31/47  brain]
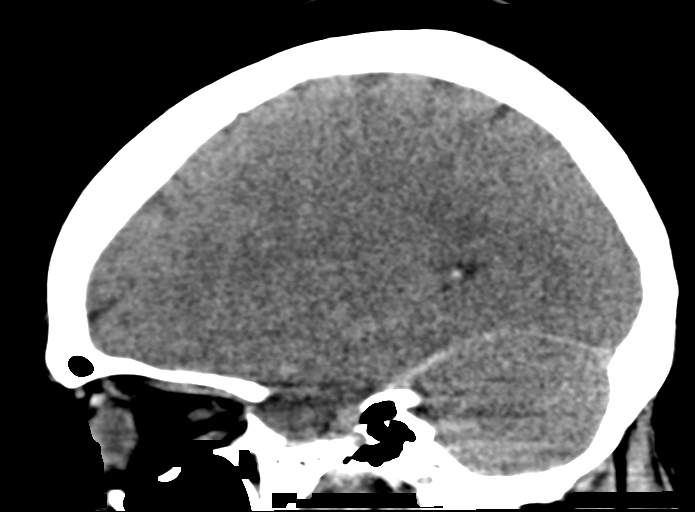

[15 of 46 positions shown; findings below may reference images not displayed]

FINDINGS: Brain: There is diffuse effacement of sulci architecture and
gray-white matter differentiation throughout the brain, suspicious
for diffuse brain edema. There are scattered areas of
hypoattenuation throughout the subcortical gray and white-matter,
particularly prominent in the left frontal lobe, where there is
effacement of the left lateral ventricle. This appearance is
concerning for superimposed mass lesions producing mass effect on
the ventricle. The hyperdense appearance of the intracranial vessels
and membranes is likely artifactual, in relation to the
hypoattenuated appearance of the brain parenchyma. No evidence of
herniation.

Vascular: Diffusely hyperdense appearance of the intracranial
vessels.

Skull: Normal. Negative for fracture or focal lesion.

Sinuses/Orbits: Frothy material fills the dependent portion of the
ethmoid sinuses. The remainder of the paranasal sinuses and mastoid
air cells are well aerated.

Other: None.
IMPRESSION: Diffuse brain parenchymal edema, which may be seen with anoxic brain
injury.

Scattered areas of hypoattenuation throughout the subcortical brain
parenchymal is concerning for superimposed mass lesions. This
process is particularly prominent in the left frontal lobe, where
there is effacement of the left lateral ventricle. Septic emboli or
other scattered mass lesions, not well visualized by CT are of
consideration.

Critical Value/emergent results were called by telephone at the time
of interpretation on 03/17/2016 at [DATE] to Dr. Martinus, who
verbally acknowledged these results.

## 2017-10-10 IMAGING — DX DG CHEST 1V PORT
1 series · 1 of 1 positions shown · non-contrast
Comparison: 01/29/2016

CLINICAL DATA: Cardiac arrest. Respiratory distress. Cocaine use
for 3 days. Hemoptysis and shortness of breath. Patient suddenly
became unresponsive without palpable heartbeat during transfer to
hospital. CPR.

EXAM:
PORTABLE CHEST 1 VIEW

[chest ap]
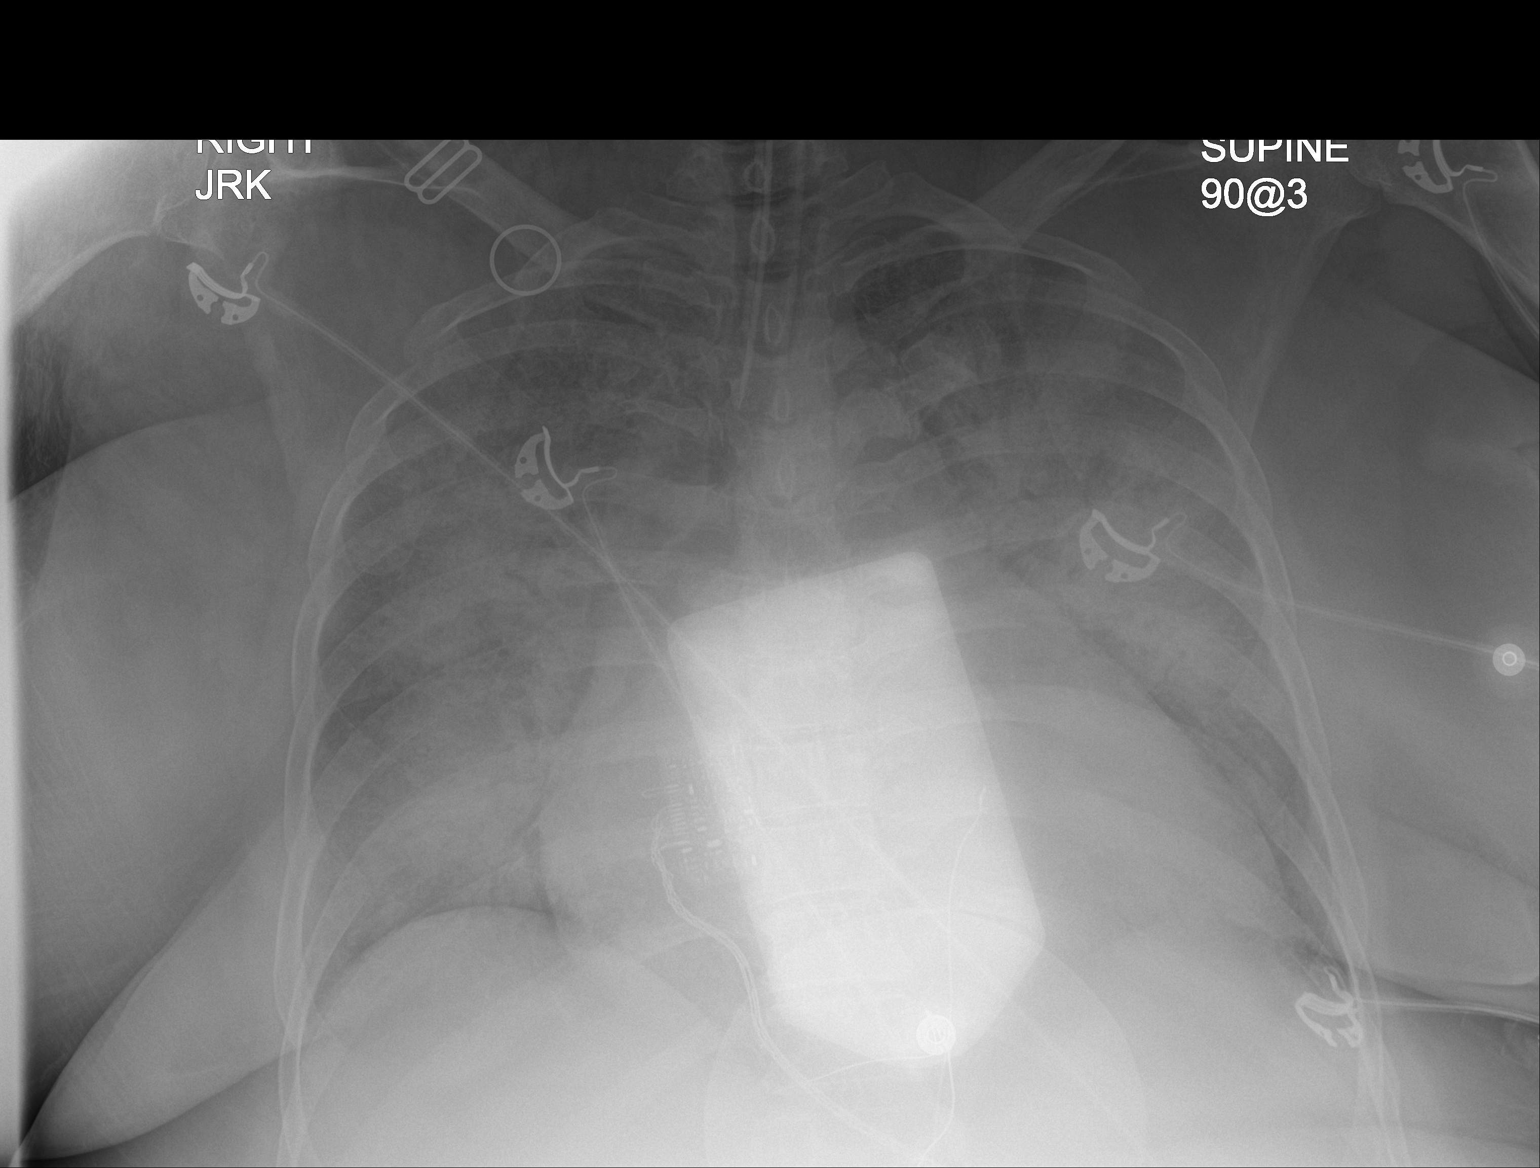

[1 of 1 positions shown; findings below may reference images not displayed]

FINDINGS: Endotracheal tube tip measures 3.6 cm above the carina. Shallow
inspiration. Cardiac enlargement. Diffuse bilateral airspace disease
suggesting edema or infiltration. Lucency along the mediastinal
margins may indicate pneumo mediastinum. No pneumothorax. No
blunting of costophrenic angles. No visible rib fractures.
IMPRESSION: Cardiac enlargement with diffuse bilateral airspace disease in the
lungs. Possible pneumo mediastinum. Endotracheal tube tip measures
3.6 cm above the carina.

## 2017-10-11 IMAGING — DX DG ABDOMEN 1V
1 series · 1 of 1 positions shown · non-contrast
Comparison: None.

CLINICAL DATA: Patient was brought to the ED with CPR in progress.
Intubated. Lines placed. History of hypertension.

EXAM:
ABDOMEN - 1 VIEW

[abdomen kub]
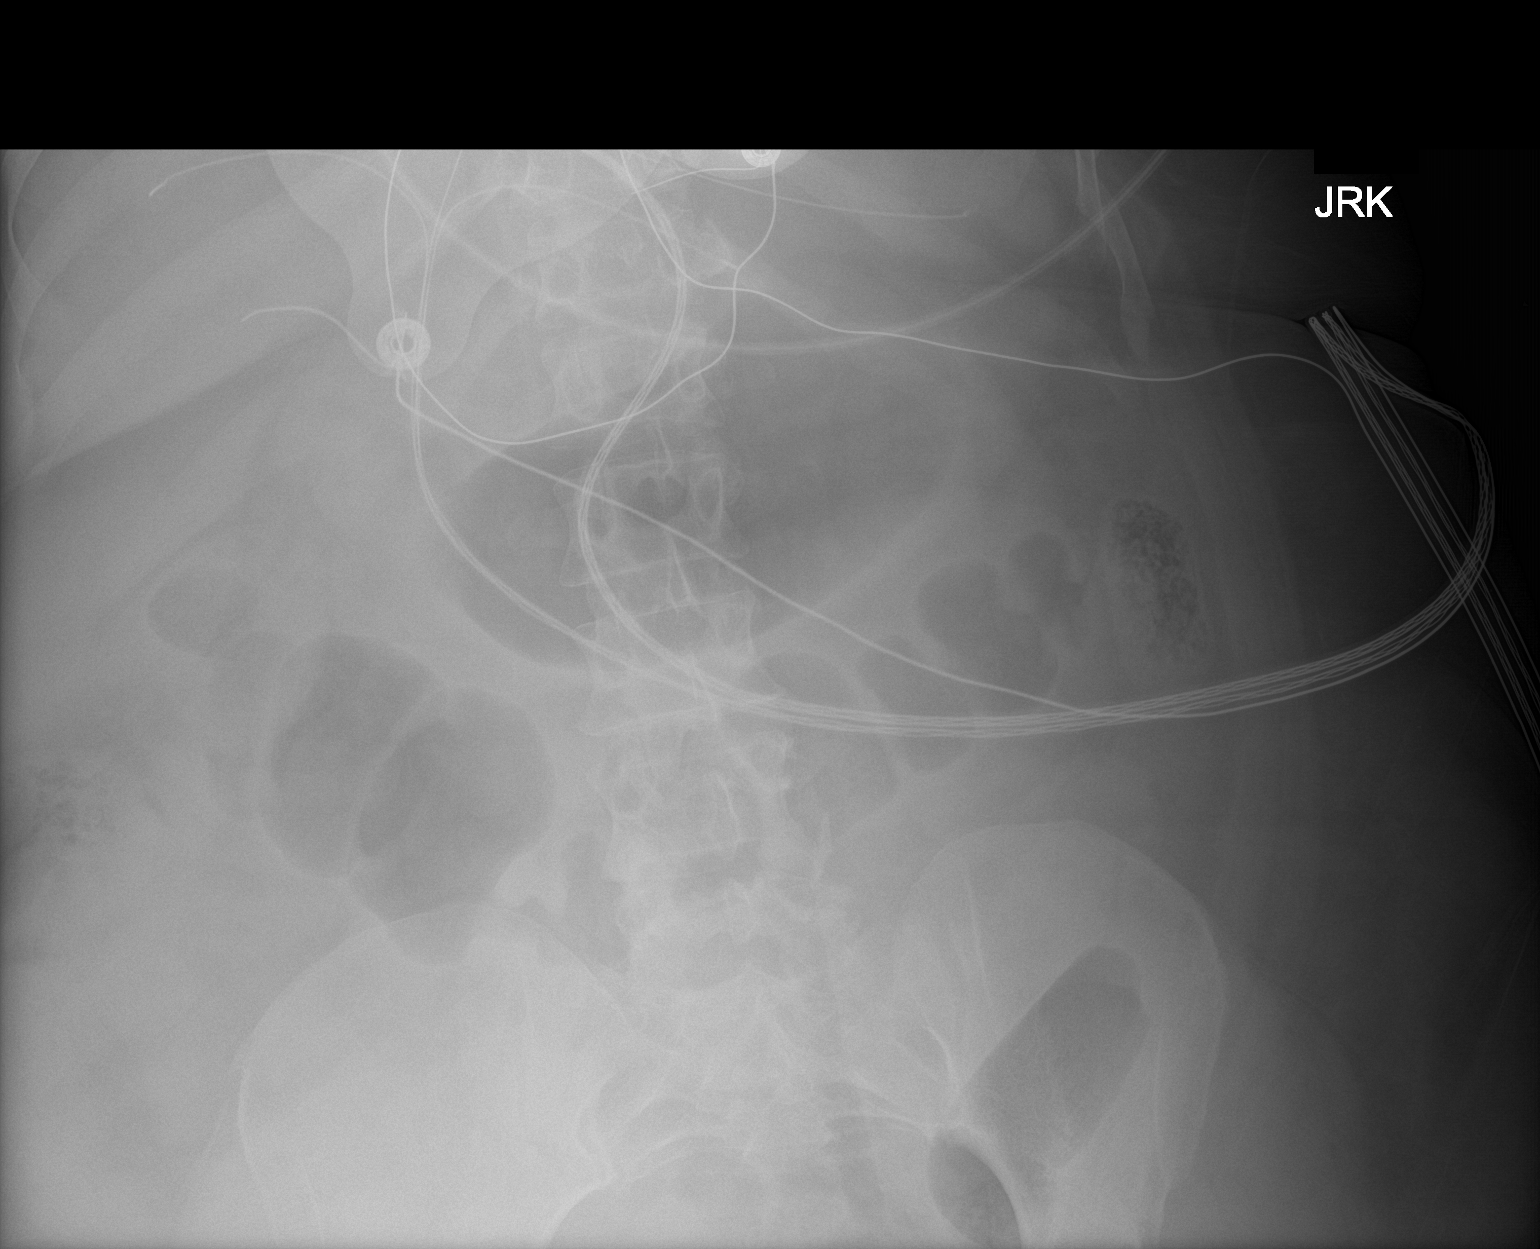

[1 of 1 positions shown; findings below may reference images not displayed]

FINDINGS: Enteric tube tip is in the left upper quadrant consistent with
location in the upper stomach. Gas and stool in the colon without
distention. Old left rib fractures.
IMPRESSION: Enteric tube tip is in the left upper quadrant consistent with
location in the upper stomach.

## 2017-10-11 IMAGING — DX DG CHEST 1V PORT
1 series · 2 of 2 positions shown · non-contrast
Comparison: 03/17/2016

CLINICAL DATA: CPR in progress upon admission to the ED. Line
placements. History of hypertension.

EXAM:
PORTABLE CHEST 1 VIEW

[Series 1: chest ap · 0.14mm/px · 2 of 2 slices shown]
[im 1/2]
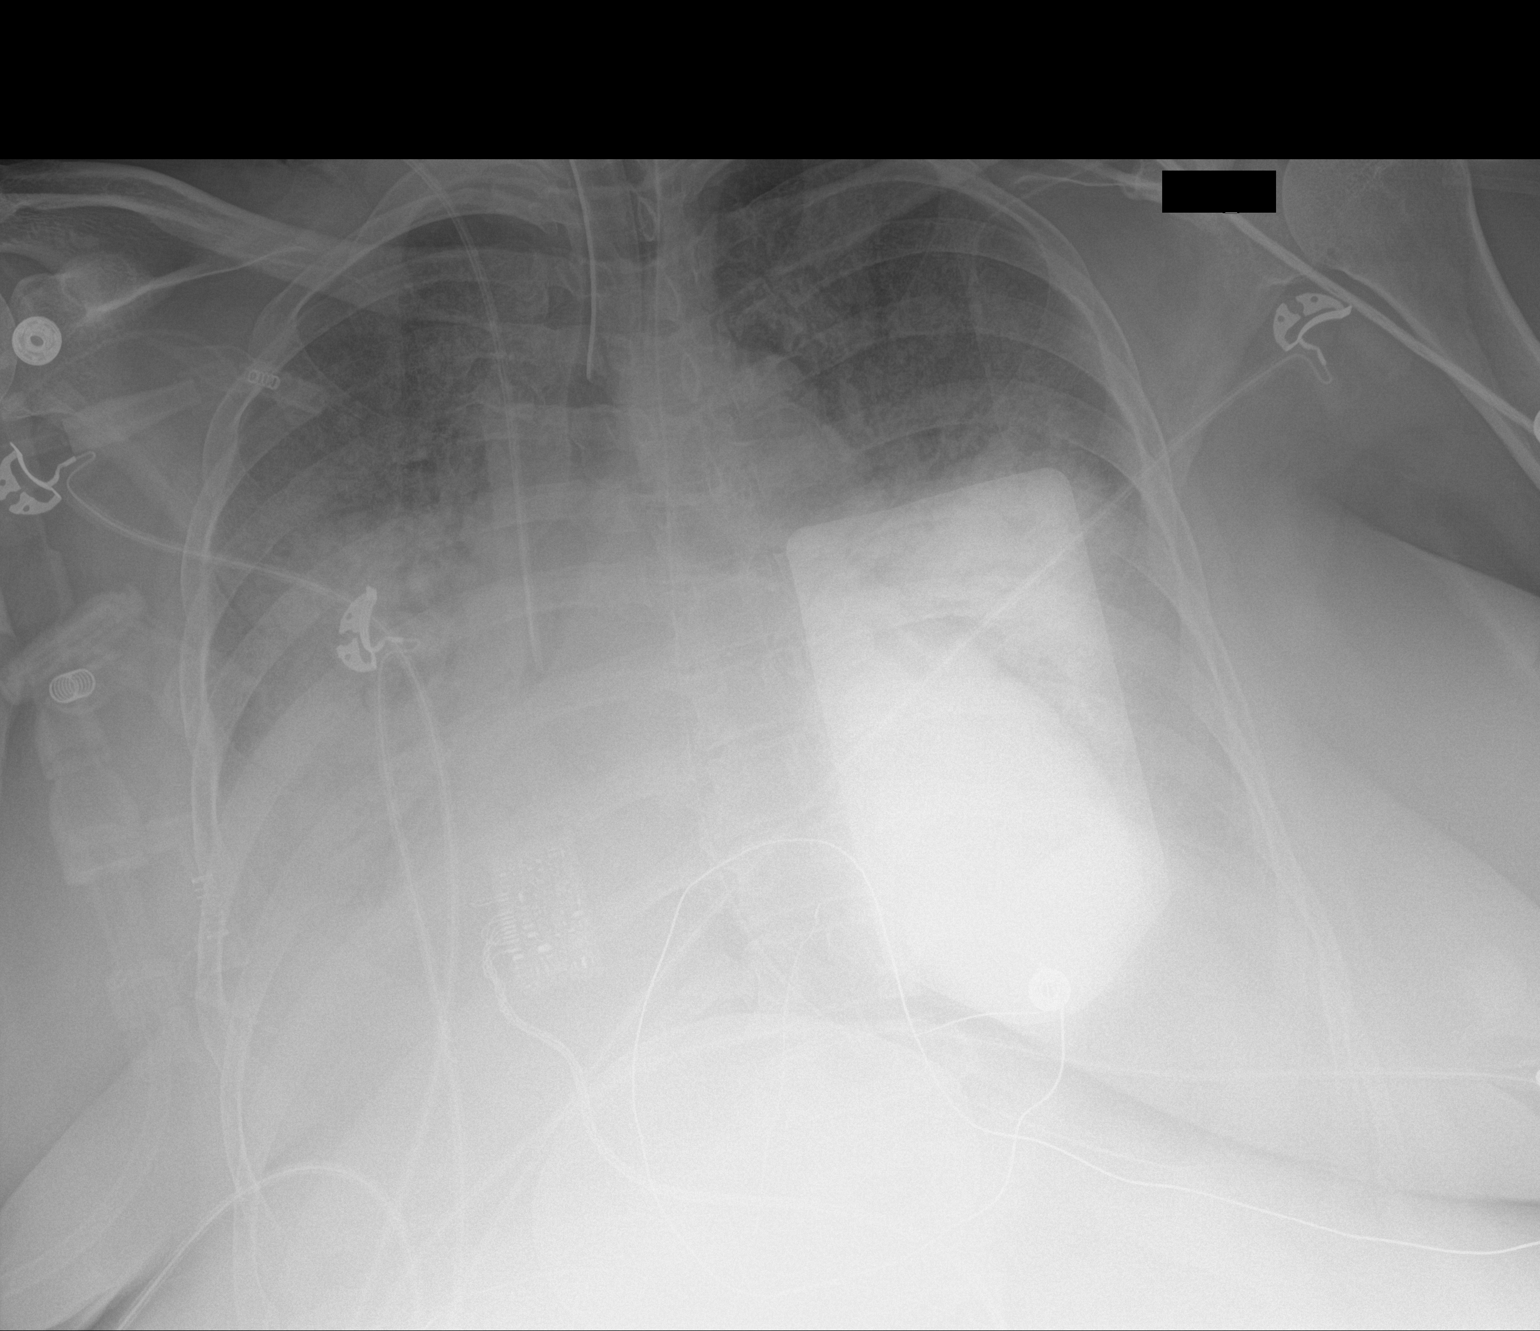
[im 2/2]
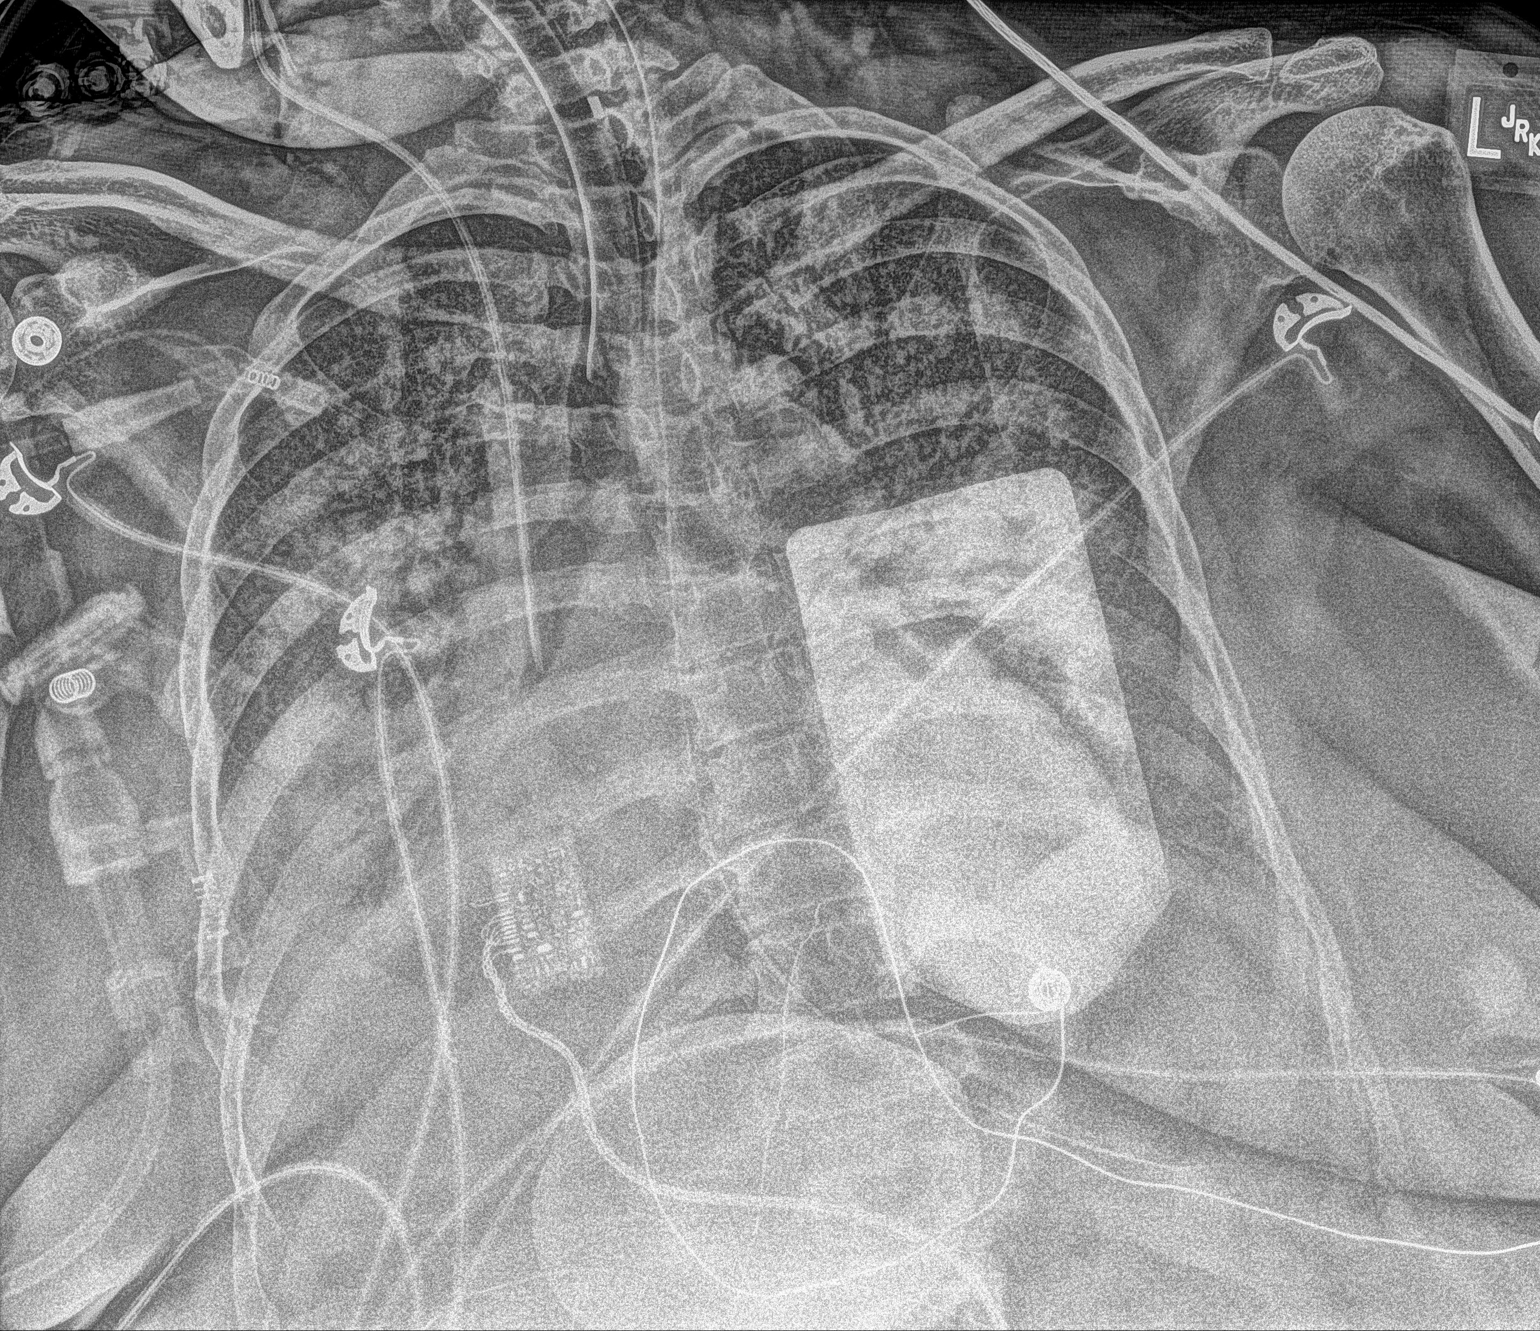

[2 of 2 positions shown; findings below may reference images not displayed]

FINDINGS: Endotracheal tube with tip measuring 3.6 cm above the carina. Right
central venous catheter has been placed with tip over the low SVC
region. No pneumothorax. Enteric tube tip is in the left upper
quadrant consistent with location in the upper stomach. Cardiac
enlargement with diffuse bilateral airspace infiltrates in the
lungs. This could indicate edema, pneumonia, or ARDS. No changes to
suggest pneumo mediastinum.
IMPRESSION: Appliances appear in satisfactory location. Cardiac enlargement with
diffuse bilateral airspace disease.
